# Patient Record
Sex: Female | Born: 1985 | Race: Black or African American | Hispanic: No | Marital: Single | Smoking: Never smoker
Health system: Southern US, Community
[De-identification: ages and names within clinical notes are randomized; demographics above are authoritative.]

## PROBLEM LIST (undated history)

## (undated) DIAGNOSIS — D684 Acquired coagulation factor deficiency: Secondary | ICD-10-CM

## (undated) DIAGNOSIS — Z5189 Encounter for other specified aftercare: Secondary | ICD-10-CM

## (undated) DIAGNOSIS — Z789 Other specified health status: Secondary | ICD-10-CM

## (undated) HISTORY — PX: NO PAST SURGERIES: SHX2092

---

## 2019-09-22 ENCOUNTER — Inpatient Hospital Stay (HOSPITAL_COMMUNITY)
Admission: AD | Admit: 2019-09-22 | Discharge: 2019-09-23 | Disposition: A | Discharge status: Home / Self Care | Payer: Medicaid Other | Attending: Obstetrics & Gynecology | Admitting: Obstetrics & Gynecology

## 2019-09-22 ENCOUNTER — Encounter (HOSPITAL_COMMUNITY): Payer: Self-pay | Admitting: Obstetrics & Gynecology

## 2019-09-22 ENCOUNTER — Other Ambulatory Visit: Payer: Self-pay

## 2019-09-22 DIAGNOSIS — O209 Hemorrhage in early pregnancy, unspecified: Secondary | ICD-10-CM

## 2019-09-22 DIAGNOSIS — R102 Pelvic and perineal pain: Secondary | ICD-10-CM | POA: Insufficient documentation

## 2019-09-22 DIAGNOSIS — Z3A12 12 weeks gestation of pregnancy: Secondary | ICD-10-CM | POA: Diagnosis not present

## 2019-09-22 DIAGNOSIS — O021 Missed abortion: Secondary | ICD-10-CM | POA: Diagnosis not present

## 2019-09-22 HISTORY — DX: Other specified health status: Z78.9

## 2019-09-22 LAB — POCT PREGNANCY, URINE: Preg Test, Ur: POSITIVE — AB

## 2019-09-22 NOTE — MAU Note (Signed)
Began having light cramping and bleeding yesterday, has intensified today.  States she only sees it when she wipes.  + HPT at the beginning of March.  LMP 06/28/19.  Denies any recent intercourse.

## 2019-09-23 ENCOUNTER — Inpatient Hospital Stay (HOSPITAL_COMMUNITY): Payer: Medicaid Other

## 2019-09-23 ENCOUNTER — Encounter (HOSPITAL_COMMUNITY): Payer: Self-pay | Admitting: Obstetrics & Gynecology

## 2019-09-23 DIAGNOSIS — Z3A12 12 weeks gestation of pregnancy: Secondary | ICD-10-CM

## 2019-09-23 DIAGNOSIS — O021 Missed abortion: Secondary | ICD-10-CM

## 2019-09-23 DIAGNOSIS — O209 Hemorrhage in early pregnancy, unspecified: Secondary | ICD-10-CM

## 2019-09-23 LAB — COMPREHENSIVE METABOLIC PANEL
ALT: 14 U/L (ref 0–44)
AST: 13 U/L — ABNORMAL LOW (ref 15–41)
Albumin: 4.2 g/dL (ref 3.5–5.0)
Alkaline Phosphatase: 39 U/L (ref 38–126)
Anion gap: 8 (ref 5–15)
BUN: <5 mg/dL — ABNORMAL LOW (ref 6–20)
CO2: 24 mmol/L (ref 22–32)
Calcium: 9.4 mg/dL (ref 8.9–10.3)
Chloride: 106 mmol/L (ref 98–111)
Creatinine, Ser: 0.57 mg/dL (ref 0.44–1.00)
GFR calc Af Amer: >60 mL/min (ref >60)
GFR calc non Af Amer: >60 mL/min (ref >60)
Glucose, Bld: 91 mg/dL (ref 70–99)
Potassium: 3.9 mmol/L (ref 3.5–5.1)
Sodium: 138 mmol/L (ref 135–145)
Total Bilirubin: 0.4 mg/dL (ref 0.3–1.2)
Total Protein: 6.6 g/dL (ref 6.5–8.1)

## 2019-09-23 LAB — URINALYSIS, ROUTINE W REFLEX MICROSCOPIC
Bilirubin Urine: NEGATIVE
Glucose, UA: NEGATIVE mg/dL
Ketones, ur: NEGATIVE mg/dL
Leukocytes,Ua: NEGATIVE
Nitrite: NEGATIVE
Protein, ur: NEGATIVE mg/dL
Specific Gravity, Urine: 1.01 (ref 1.005–1.030)
pH: 8 (ref 5.0–8.0)

## 2019-09-23 LAB — CBC
HCT: 35.4 % — ABNORMAL LOW (ref 36.0–46.0)
Hemoglobin: 11.4 g/dL — ABNORMAL LOW (ref 12.0–15.0)
MCH: 28.8 pg (ref 26.0–34.0)
MCHC: 32.2 g/dL (ref 30.0–36.0)
MCV: 89.4 fL (ref 80.0–100.0)
Platelets: 268 10*3/uL (ref 150–400)
RBC: 3.96 MIL/uL (ref 3.87–5.11)
RDW: 12.6 % (ref 11.5–15.5)
WBC: 5.9 10*3/uL (ref 4.0–10.5)
nRBC: 0 % (ref 0.0–0.2)

## 2019-09-23 LAB — WET PREP, GENITAL
Sperm: NONE SEEN
Trich, Wet Prep: NONE SEEN
Yeast Wet Prep HPF POC: NONE SEEN

## 2019-09-23 LAB — HCG, QUANTITATIVE, PREGNANCY: hCG, Beta Chain, Quant, S: 1520 m[IU]/mL — ABNORMAL HIGH (ref <5)

## 2019-09-23 LAB — ABO/RH: ABO/RH(D): B POS

## 2019-09-23 LAB — GC/CHLAMYDIA PROBE AMP (~~LOC~~) NOT AT ARMC
Chlamydia: NEGATIVE
Comment: NEGATIVE
Comment: NORMAL
Neisseria Gonorrhea: NEGATIVE

## 2019-09-23 LAB — HIV ANTIBODY (ROUTINE TESTING W REFLEX): HIV Screen 4th Generation wRfx: NONREACTIVE

## 2019-09-23 NOTE — MAU Provider Note (Addendum)
Chief Complaint: Abdominal Pain and Vaginal Bleeding   First Provider Initiated Contact with Patient 09/23/19 0032        SUBJECTIVE HPI: Michelle Guzman is a 34 y.o. [id number] at [redacted]w[redacted]d by LMP who presents to maternity admissions reporting pelvic pain and vaginal bleeding since yesterday.  Both have gotten worse today. . She denies vaginal itching/burning, urinary symptoms, h/a, dizziness, n/v, or fever/chills.    Abdominal Pain This is a new problem. The current episode started yesterday. The onset quality is gradual. The problem occurs intermittently. The problem has been unchanged. The quality of the pain is cramping. The abdominal pain does not radiate. Pertinent negatives include no constipation, diarrhea, dysuria, fever, frequency, nausea or vomiting. Nothing aggravates the pain. The pain is relieved by nothing. She has tried nothing for the symptoms.  Vaginal Bleeding The patient's primary symptoms include pelvic pain and vaginal bleeding. The patient's pertinent negatives include no genital itching, genital lesions or genital odor. This is a new problem. The current episode started yesterday. The problem occurs intermittently. The problem has been unchanged. The pain is mild. The problem affects both sides. She is pregnant. Associated symptoms include abdominal pain. Pertinent negatives include no constipation, diarrhea, dysuria, fever, frequency, nausea or vomiting. The vaginal discharge was bloody. The vaginal bleeding is lighter than menses. She has not been passing clots. She has not been passing tissue. Nothing aggravates the symptoms. She has tried nothing for the symptoms.   RN Note Began having light cramping and bleeding yesterday, has intensified today.  States she only sees it when she wipes.  + HPT at the beginning of March.  LMP 06/28/19.  Denies any recent intercourse  Past Medical History:  Diagnosis Date  . Medical history non-contributory    Past Surgical History:   Procedure Laterality Date  . NO PAST SURGERIES     Social History   Socioeconomic History  . Marital status: Single    Spouse name: Not on file  . Number of children: Not on file  . Years of education: Not on file  . Highest education level: Not on file  Occupational History  . Not on file  Tobacco Use  . Smoking status: Never Smoker  . Smokeless tobacco: Never Used  Substance and Sexual Activity  . Alcohol use: Not Currently  . Drug use: Not Currently    Types: Marijuana    Comment: stopped a couple weeks ago per pt on 09/23/19  . Sexual activity: Not Currently  Other Topics Concern  . Not on file  Social History Narrative  . Not on file   Social Determinants of Health   Financial Resource Strain:   . Difficulty of Paying Living Expenses:   Food Insecurity:   . Worried About Programme researcher, broadcasting/film/video in the Last Year:   . Barista in the Last Year:   Transportation Needs:   . Freight forwarder (Medical):   Marland Kitchen Lack of Transportation (Non-Medical):   Physical Activity:   . Days of Exercise per Week:   . Minutes of Exercise per Session:   Stress:   . Feeling of Stress :   Social Connections:   . Frequency of Communication with Friends and Family:   . Frequency of Social Gatherings with Friends and Family:   . Attends Religious Services:   . Active Member of Clubs or Organizations:   . Attends Banker Meetings:   Marland Kitchen Marital Status:   Intimate Partner Violence:   .  Fear of Current or Ex-Partner:   . Emotionally Abused:   Marland Kitchen Physically Abused:   . Sexually Abused:    No current facility-administered medications on file prior to encounter.   Current Outpatient Medications on File Prior to Encounter  Medication Sig Dispense Refill  . Prenatal Vit-Fe Fumarate-FA (PRENATAL PO) Take by mouth.     Allergies  Allergen Reactions  . Orange Juice [Orange Oil] Swelling    I have reviewed patient's Past Medical Hx, Surgical Hx, Family Hx, Social Hx,  medications and allergies.   ROS:  Review of Systems  Constitutional: Negative for fever.  Gastrointestinal: Positive for abdominal pain. Negative for constipation, diarrhea, nausea and vomiting.  Genitourinary: Positive for pelvic pain and vaginal bleeding. Negative for dysuria and frequency.   Review of Systems  Other systems negative   Physical Exam  Physical Exam Patient Vitals for the past 24 hrs:  BP Temp Pulse Resp Weight  09/22/19 2254 127/74 98.9 F (37.2 C) 60 15 49.5 kg   Constitutional: Well-developed, well-nourished female in no acute distress.  Cardiovascular: normal rate Respiratory: normal effort GI: Abd soft, non-tender. Pos BS x 4 MS: Extremities nontender, no edema, normal ROM Neurologic: Alert and oriented x 4.  GU: Neg CVAT.  PELVIC EXAM: Cervix pink, visually closed, without lesion, small amount of bloody discharge, vaginal walls and external genitalia normal   LAB RESULTS Results for orders placed or performed during the hospital encounter of 09/22/19 (from the past 24 hour(s))  Urinalysis, Routine w reflex microscopic     Status: Abnormal   Collection Time: 09/22/19 10:47 PM  Result Value Ref Range   Color, Urine YELLOW YELLOW   APPearance CLEAR CLEAR   Specific Gravity, Urine 1.010 1.005 - 1.030   pH 8.0 5.0 - 8.0   Glucose, UA NEGATIVE NEGATIVE mg/dL   Hgb urine dipstick LARGE (A) NEGATIVE   Bilirubin Urine NEGATIVE NEGATIVE   Ketones, ur NEGATIVE NEGATIVE mg/dL   Protein, ur NEGATIVE NEGATIVE mg/dL   Nitrite NEGATIVE NEGATIVE   Leukocytes,Ua NEGATIVE NEGATIVE   RBC / HPF 0-5 0 - 5 RBC/hpf   WBC, UA 0-5 0 - 5 WBC/hpf   Bacteria, UA RARE (A) NONE SEEN   Squamous Epithelial / LPF 0-5 0 - 5   Mucus PRESENT   Pregnancy, urine POC     Status: Abnormal   Collection Time: 09/22/19 11:15 PM  Result Value Ref Range   Preg Test, Ur POSITIVE (A) NEGATIVE  Wet prep, genital     Status: Abnormal   Collection Time: 09/22/19 11:46 PM  Result Value  Ref Range   Yeast Wet Prep HPF POC NONE SEEN NONE SEEN   Trich, Wet Prep NONE SEEN NONE SEEN   Clue Cells Wet Prep HPF POC PRESENT (A) NONE SEEN   WBC, Wet Prep HPF POC FEW (A) NONE SEEN   Sperm NONE SEEN   CBC     Status: Abnormal   Collection Time: 09/22/19 11:54 PM  Result Value Ref Range   WBC 5.9 4.0 - 10.5 K/uL   RBC 3.96 3.87 - 5.11 MIL/uL   Hemoglobin 11.4 (L) 12.0 - 15.0 g/dL   HCT 69.4 (L) 85.4 - 62.7 %   MCV 89.4 80.0 - 100.0 fL   MCH 28.8 26.0 - 34.0 pg   MCHC 32.2 30.0 - 36.0 g/dL   RDW 03.5 00.9 - 38.1 %   Platelets 268 150 - 400 K/uL   nRBC 0.0 0.0 - 0.2 %  hCG, quantitative,  pregnancy     Status: Abnormal   Collection Time: 09/22/19 11:54 PM  Result Value Ref Range   hCG, Beta Chain, Quant, S 1,520 (H) <5 mIU/mL  Comprehensive metabolic panel     Status: Abnormal   Collection Time: 09/22/19 11:54 PM  Result Value Ref Range   Sodium 138 135 - 145 mmol/L   Potassium 3.9 3.5 - 5.1 mmol/L   Chloride 106 98 - 111 mmol/L   CO2 24 22 - 32 mmol/L   Glucose, Bld 91 70 - 99 mg/dL   BUN <5 (L) 6 - 20 mg/dL   Creatinine, Ser 0.57 0.44 - 1.00 mg/dL   Calcium 9.4 8.9 - 10.3 mg/dL   Total Protein 6.6 6.5 - 8.1 g/dL   Albumin 4.2 3.5 - 5.0 g/dL   AST 13 (L) 15 - 41 U/L   ALT 14 0 - 44 U/L   Alkaline Phosphatase 39 38 - 126 U/L   Total Bilirubin 0.4 0.3 - 1.2 mg/dL   GFR calc non Af Amer >60 >60 mL/min   GFR calc Af Amer >60 >60 mL/min   Anion gap 8 5 - 15  HIV Antibody (routine testing w rflx)     Status: None   Collection Time: 09/22/19 11:54 PM  Result Value Ref Range   HIV Screen 4th Generation wRfx NON REACTIVE NON REACTIVE  ABO/Rh     Status: None   Collection Time: 09/23/19  1:38 AM  Result Value Ref Range   ABO/RH(D) B POS    No rh immune globuloin      NOT A RH IMMUNE GLOBULIN CANDIDATE, PT RH POSITIVE Performed at [facility], [address] [place], [place] [address]     IMAGING [place] Comp Less 14 Wks  Result Date: 09/23/2019 CLINICAL  DATA:  Bleeding, cramping EXAM: OBSTETRIC <14 WK [place] AND TRANSVAGINAL OB US TECHNIQUE: Both transabdominal and transvaginal ultrasound examinations were performed for complete evaluation of the gestation as well as the maternal uterus, adnexal regions, and pelvic cul-de-sac. Transvaginal technique was performed to assess early pregnancy. COMPARISON:  None. FINDINGS: Intrauterine gestational sac: Single, irregularly shaped at the level of the cervix. Yolk sac:  Not visualized Embryo:  Not visualized Cardiac Activity: Not visualized Heart Rate:   bpm MSD: 36.4 mm   8 w   6 d CRL:    mm    w    d                  [place] EDC: Subchorionic hemorrhage:  None visualized. Maternal uterus/adnexae: No adnexal mass or free fluid. IMPRESSION: Irregularly-shaped gestational sac at the level of the cervix. No fetal pole visualized. Findings meet definitive criteria for failed pregnancy. This follows SRU consensus guidelines: Diagnostic Criteria for Nonviable Pregnancy Early in the First Trimester. Alison Stalling J Med [phone]. Electronically Signed   By: Rolm Baptise M.D.   On: 09/23/2019 01:08   US OB Transvaginal  Result Date: 09/23/2019 CLINICAL DATA:  Bleeding, cramping EXAM: OBSTETRIC <14 WK [place] AND TRANSVAGINAL OB US TECHNIQUE: Both transabdominal and transvaginal ultrasound examinations were performed for complete evaluation of the gestation as well as the maternal uterus, adnexal regions, and pelvic cul-de-sac. Transvaginal technique was performed to assess early pregnancy. COMPARISON:  None. FINDINGS: Intrauterine gestational sac: Single, irregularly shaped at the level of the cervix. Yolk sac:  Not visualized Embryo:  Not visualized Cardiac Activity: Not visualized Heart Rate:   bpm MSD: 36.4 mm   8  w   6 d CRL:    mm    w    d                  [place] EDC: Subchorionic hemorrhage:  None visualized. Maternal uterus/adnexae: No adnexal mass or free fluid. IMPRESSION: Irregularly-shaped gestational sac at the level of the  cervix. No fetal pole visualized. Findings meet definitive criteria for failed pregnancy. This follows SRU consensus guidelines: Diagnostic Criteria for Nonviable Pregnancy Early in the First Trimester. Macy Mis J Med [phone]. Electronically Signed   By: Charlett Nose M.D.   On: 09/23/2019 01:08     MAU Management/MDM: Ordered usual first trimester r/o ectopic labs.   Pelvic exam and cultures done Will check baseline Ultrasound to rule out ectopic.  This bleeding/pain can represent a normal pregnancy with bleeding, spontaneous abortion or even an ectopic which can be life-threatening.  The process as listed above helps to determine which of these is present.  Reviewed findings with patient.  Discussed that ultrasound reflects a missed abortion.  Discussed option of expectant management vs cytotec induction.  She prefers to wait expectantly   We reviewed natural course of SAB and warning signs.    ASSESSMENT Single intrauterine pregnancy at [redacted]w[redacted]d Missed abortion   PLAN Discharge home Expectant management Bleeding precautions Will schedule followup at clinic post SAB  Pt stable at time of discharge. Encouraged to return here or to other Urgent Care/ED if she develops worsening of symptoms, increase in pain, fever, or other concerning symptoms.    Wynelle Bourgeois CNM, MSN Certified Nurse-Midwife 09/23/2019  12:33 AM

## 2019-09-23 NOTE — Discharge Instructions (Signed)

## 2019-09-24 ENCOUNTER — Encounter (HOSPITAL_COMMUNITY): Payer: Self-pay | Admitting: Obstetrics and Gynecology

## 2019-09-24 ENCOUNTER — Ambulatory Visit (HOSPITAL_COMMUNITY)
Admission: AD | Admit: 2019-09-24 | Discharge: 2019-09-24 | Disposition: A | Discharge status: Home / Self Care | Payer: Medicaid Other | Attending: Obstetrics and Gynecology | Admitting: Obstetrics and Gynecology

## 2019-09-24 ENCOUNTER — Inpatient Hospital Stay (HOSPITAL_COMMUNITY): Payer: Medicaid Other | Admitting: Anesthesiology

## 2019-09-24 ENCOUNTER — Inpatient Hospital Stay (HOSPITAL_COMMUNITY): Payer: Medicaid Other

## 2019-09-24 ENCOUNTER — Encounter (HOSPITAL_COMMUNITY)
Admission: AD | Disposition: A | Discharge status: Home / Self Care | Payer: Self-pay | Source: Home / Self Care | Attending: Obstetrics and Gynecology

## 2019-09-24 DIAGNOSIS — E561 Deficiency of vitamin K: Secondary | ICD-10-CM | POA: Insufficient documentation

## 2019-09-24 DIAGNOSIS — O034 Incomplete spontaneous abortion without complication: Secondary | ICD-10-CM | POA: Diagnosis present

## 2019-09-24 DIAGNOSIS — O021 Missed abortion: Secondary | ICD-10-CM

## 2019-09-24 DIAGNOSIS — Z20822 Contact with and (suspected) exposure to covid-19: Secondary | ICD-10-CM | POA: Insufficient documentation

## 2019-09-24 DIAGNOSIS — Z3A08 8 weeks gestation of pregnancy: Secondary | ICD-10-CM

## 2019-09-24 DIAGNOSIS — O209 Hemorrhage in early pregnancy, unspecified: Secondary | ICD-10-CM

## 2019-09-24 DIAGNOSIS — O031 Delayed or excessive hemorrhage following incomplete spontaneous abortion: Secondary | ICD-10-CM

## 2019-09-24 HISTORY — PX: DILATION AND CURETTAGE OF UTERUS: SHX78

## 2019-09-24 HISTORY — DX: Acquired coagulation factor deficiency: D68.4

## 2019-09-24 LAB — DIC (DISSEMINATED INTRAVASCULAR COAGULATION)PANEL
D-Dimer, Quant: <0.27 ug/mL-FEU (ref 0.00–0.50)
Fibrinogen: 154 mg/dL — ABNORMAL LOW (ref 210–475)
INR: 1.2 (ref 0.8–1.2)
Platelets: 210 10*3/uL (ref 150–400)
Prothrombin Time: 15 seconds (ref 11.4–15.2)
Smear Review: NONE SEEN
aPTT: 30 seconds (ref 24–36)

## 2019-09-24 LAB — RESPIRATORY PANEL BY RT PCR (FLU A&B, COVID)
Influenza A by PCR: NEGATIVE
Influenza B by PCR: NEGATIVE
SARS Coronavirus 2 by RT PCR: NEGATIVE

## 2019-09-24 LAB — PREPARE RBC (CROSSMATCH)

## 2019-09-24 LAB — COMPREHENSIVE METABOLIC PANEL
ALT: 16 U/L (ref 0–44)
AST: 13 U/L — ABNORMAL LOW (ref 15–41)
Albumin: 2.6 g/dL — ABNORMAL LOW (ref 3.5–5.0)
Alkaline Phosphatase: 27 U/L — ABNORMAL LOW (ref 38–126)
Anion gap: 8 (ref 5–15)
BUN: 9 mg/dL (ref 6–20)
CO2: 20 mmol/L — ABNORMAL LOW (ref 22–32)
Calcium: 7.8 mg/dL — ABNORMAL LOW (ref 8.9–10.3)
Chloride: 111 mmol/L (ref 98–111)
Creatinine, Ser: 0.59 mg/dL (ref 0.44–1.00)
GFR calc Af Amer: >60 mL/min (ref >60)
GFR calc non Af Amer: >60 mL/min (ref >60)
Glucose, Bld: 99 mg/dL (ref 70–99)
Potassium: 4.1 mmol/L (ref 3.5–5.1)
Sodium: 139 mmol/L (ref 135–145)
Total Bilirubin: 0.3 mg/dL (ref 0.3–1.2)
Total Protein: 4.3 g/dL — ABNORMAL LOW (ref 6.5–8.1)

## 2019-09-24 LAB — POCT I-STAT, CHEM 8
BUN: 5 mg/dL — ABNORMAL LOW (ref 6–20)
Calcium, Ion: 1.07 mmol/L — ABNORMAL LOW (ref 1.15–1.40)
Chloride: 108 mmol/L (ref 98–111)
Creatinine, Ser: 0.4 mg/dL — ABNORMAL LOW (ref 0.44–1.00)
Glucose, Bld: 91 mg/dL (ref 70–99)
HCT: 18 % — ABNORMAL LOW (ref 36.0–46.0)
Hemoglobin: 6.1 g/dL — CL (ref 12.0–15.0)
Potassium: 4.8 mmol/L (ref 3.5–5.1)
Sodium: 141 mmol/L (ref 135–145)
TCO2: 21 mmol/L — ABNORMAL LOW (ref 22–32)

## 2019-09-24 LAB — CBC
HCT: 24.3 % — ABNORMAL LOW (ref 36.0–46.0)
Hemoglobin: 7.5 g/dL — ABNORMAL LOW (ref 12.0–15.0)
MCH: 28.4 pg (ref 26.0–34.0)
MCHC: 30.9 g/dL (ref 30.0–36.0)
MCV: 92 fL (ref 80.0–100.0)
Platelets: 213 10*3/uL (ref 150–400)
RBC: 2.64 MIL/uL — ABNORMAL LOW (ref 3.87–5.11)
RDW: 12.8 % (ref 11.5–15.5)
WBC: 7.4 10*3/uL (ref 4.0–10.5)
nRBC: 0 % (ref 0.0–0.2)

## 2019-09-24 SURGERY — DILATION AND CURETTAGE
Anesthesia: General

## 2019-09-24 MED ORDER — SUCCINYLCHOLINE CHLORIDE 200 MG/10ML IV SOSY
PREFILLED_SYRINGE | INTRAVENOUS | Status: DC | PRN
Start: 2019-09-24 — End: 2019-09-24
  Administered 2019-09-24: 100 mg via INTRAVENOUS

## 2019-09-24 MED ORDER — SILVER NITRATE-POT NITRATE 75-25 % EX MISC
CUTANEOUS | Status: AC
  Filled 2019-09-24: qty 10

## 2019-09-24 MED ORDER — DEXAMETHASONE SODIUM PHOSPHATE 10 MG/ML IJ SOLN
INTRAMUSCULAR | Status: DC | PRN
  Administered 2019-09-24: 4 mg via INTRAVENOUS

## 2019-09-24 MED ORDER — LACTATED RINGERS IV BOLUS
1000.0000 mL | Freq: Once | INTRAVENOUS | Status: AC
  Administered 2019-09-24: 1000 mL via INTRAVENOUS

## 2019-09-24 MED ORDER — ROCURONIUM BROMIDE 10 MG/ML (PF) SYRINGE
PREFILLED_SYRINGE | INTRAVENOUS | Status: AC
  Filled 2019-09-24: qty 10

## 2019-09-24 MED ORDER — SODIUM CHLORIDE 0.9% IV SOLUTION: Freq: Once | INTRAVENOUS | Status: DC

## 2019-09-24 MED ORDER — AMISULPRIDE (ANTIEMETIC) 5 MG/2ML IV SOLN
INTRAVENOUS | Status: AC
  Administered 2019-09-24: 5 mg via INTRAVENOUS
  Filled 2019-09-24: qty 2

## 2019-09-24 MED ORDER — SUCCINYLCHOLINE CHLORIDE 200 MG/10ML IV SOSY
PREFILLED_SYRINGE | INTRAVENOUS | Status: AC
  Filled 2019-09-24: qty 10

## 2019-09-24 MED ORDER — LACTATED RINGERS IV SOLN: INTRAVENOUS | Status: DC

## 2019-09-24 MED ORDER — PROPOFOL 10 MG/ML IV BOLUS
INTRAVENOUS | Status: AC
  Filled 2019-09-24: qty 20

## 2019-09-24 MED ORDER — SODIUM CHLORIDE 0.9 % IV SOLN
100.0000 mg | Freq: Once | INTRAVENOUS | Status: AC
  Administered 2019-09-24: 200 mg via INTRAVENOUS
  Filled 2019-09-24 (×2): qty 100

## 2019-09-24 MED ORDER — MIDAZOLAM HCL 5 MG/5ML IJ SOLN
INTRAMUSCULAR | Status: DC | PRN
  Administered 2019-09-24: 2 mg via INTRAVENOUS

## 2019-09-24 MED ORDER — LIDOCAINE 2% (20 MG/ML) 5 ML SYRINGE
INTRAMUSCULAR | Status: DC | PRN
  Administered 2019-09-24: 40 mg via INTRAVENOUS

## 2019-09-24 MED ORDER — LIDOCAINE HCL 1 % IJ SOLN
INTRAMUSCULAR | Status: AC
  Filled 2019-09-24: qty 20

## 2019-09-24 MED ORDER — PHENYLEPHRINE 40 MCG/ML (10ML) SYRINGE FOR IV PUSH (FOR BLOOD PRESSURE SUPPORT)
PREFILLED_SYRINGE | INTRAVENOUS | Status: AC
  Filled 2019-09-24: qty 20

## 2019-09-24 MED ORDER — AMISULPRIDE (ANTIEMETIC) 5 MG/2ML IV SOLN
INTRAVENOUS | Status: AC
  Filled 2019-09-24: qty 2

## 2019-09-24 MED ORDER — SODIUM CHLORIDE 0.9 % IV BOLUS
1000.0000 mL | Freq: Once | INTRAVENOUS | Status: AC
  Administered 2019-09-24: 1000 mL via INTRAVENOUS

## 2019-09-24 MED ORDER — HYDROMORPHONE HCL 1 MG/ML IJ SOLN
1.0000 mg | Freq: Once | INTRAMUSCULAR | Status: AC
  Administered 2019-09-24: 1 mg via INTRAVENOUS
  Filled 2019-09-24: qty 1

## 2019-09-24 MED ORDER — MIDAZOLAM HCL 2 MG/2ML IJ SOLN
INTRAMUSCULAR | Status: AC
  Filled 2019-09-24: qty 2

## 2019-09-24 MED ORDER — PROPOFOL 10 MG/ML IV BOLUS
INTRAVENOUS | Status: DC | PRN
  Administered 2019-09-24: 100 mg via INTRAVENOUS

## 2019-09-24 MED ORDER — ALBUMIN HUMAN 5 % IV SOLN: INTRAVENOUS | Status: DC | PRN

## 2019-09-24 MED ORDER — PHENYLEPHRINE HCL (PRESSORS) 10 MG/ML IV SOLN
INTRAVENOUS | Status: DC | PRN
  Administered 2019-09-24 (×4): 120 ug via INTRAVENOUS
  Administered 2019-09-24: 80 ug via INTRAVENOUS
  Administered 2019-09-24 (×2): 120 ug via INTRAVENOUS

## 2019-09-24 MED ORDER — BUPIVACAINE HCL (PF) 0.25 % IJ SOLN
INTRAMUSCULAR | Status: AC
  Filled 2019-09-24: qty 30

## 2019-09-24 MED ORDER — EPHEDRINE 5 MG/ML INJ
INTRAVENOUS | Status: AC
  Filled 2019-09-24: qty 20

## 2019-09-24 MED ORDER — IBUPROFEN 600 MG PO TABS
600.0000 mg | ORAL_TABLET | Freq: Four times a day (QID) | ORAL | 1 refills | Status: DC | PRN
Start: 2019-09-24 — End: 2021-08-19

## 2019-09-24 MED ORDER — SODIUM CHLORIDE 0.9 % IV SOLN: INTRAVENOUS | Status: DC

## 2019-09-24 MED ORDER — BUPIVACAINE HCL 0.25 % IJ SOLN
INTRAMUSCULAR | Status: DC | PRN
  Administered 2019-09-24: 21 mL

## 2019-09-24 MED ORDER — ONDANSETRON HCL 4 MG/2ML IJ SOLN
INTRAMUSCULAR | Status: DC | PRN
  Administered 2019-09-24 (×2): 4 mg via INTRAVENOUS

## 2019-09-24 MED ORDER — FENTANYL CITRATE (PF) 250 MCG/5ML IJ SOLN
INTRAMUSCULAR | Status: AC
  Filled 2019-09-24: qty 5

## 2019-09-24 SURGICAL SUPPLY — 26 items
CATH ROBINSON RED A/P 16FR (CATHETERS) ×3 IMPLANT
DECANTER SPIKE VIAL GLASS SM (MISCELLANEOUS) ×6 IMPLANT
FILTER UTR ASPR ASSEMBLY (MISCELLANEOUS) ×6 IMPLANT
GLOVE BIOGEL PI IND STRL 7.0 (GLOVE) ×3 IMPLANT
GLOVE BIOGEL PI IND STRL 7.5 (GLOVE) ×1 IMPLANT
GLOVE BIOGEL PI INDICATOR 7.0 (GLOVE) ×6
GLOVE BIOGEL PI INDICATOR 7.5 (GLOVE) ×2
GLOVE ECLIPSE 7.0 STRL STRAW (GLOVE) ×6 IMPLANT
GOWN STRL REUS W/ TWL LRG LVL3 (GOWN DISPOSABLE) ×3 IMPLANT
GOWN STRL REUS W/ TWL XL LVL3 (GOWN DISPOSABLE) ×1 IMPLANT
GOWN STRL REUS W/TWL LRG LVL3 (GOWN DISPOSABLE) ×6
GOWN STRL REUS W/TWL XL LVL3 (GOWN DISPOSABLE) ×2
Glove Neodern Ster SZ 7 ×2 IMPLANT
HOSE CONNECTING 18IN BERKELEY (TUBING) ×6 IMPLANT
KIT BERKELEY 1ST TRI 3/8 NO TR (MISCELLANEOUS) ×6 IMPLANT
KIT BERKELEY 1ST TRIMESTER 3/8 (MISCELLANEOUS) ×9 IMPLANT
NS IRRIG 1000ML POUR BTL (IV SOLUTION) ×6 IMPLANT
PACK VAGINAL MINOR WOMEN LF (CUSTOM PROCEDURE TRAY) ×6 IMPLANT
PAD OB MATERNITY 4.3X12.25 (PERSONAL CARE ITEMS) ×6 IMPLANT
SET BERKELEY SUCTION TUBING (SUCTIONS) ×6 IMPLANT
TOWEL GREEN STERILE FF (TOWEL DISPOSABLE) ×12 IMPLANT
UNDERPAD 30X36 HEAVY ABSORB (UNDERPADS AND DIAPERS) ×3 IMPLANT
VACURETTE 10 RIGID CVD (CANNULA) ×2 IMPLANT
VACURETTE 7MM CVD STRL WRAP (CANNULA) IMPLANT
VACURETTE 8 RIGID CVD (CANNULA) IMPLANT
VACURETTE 9 RIGID CVD (CANNULA) IMPLANT

## 2019-09-24 NOTE — MAU Provider Note (Signed)
Chief Complaint: Abdominal Pain and Vaginal Bleeding   None     SUBJECTIVE HPI: Michelle Guzman is a 34 y.o. [id number] at [redacted]w[redacted]d by LMP, [redacted]w[redacted]d by [place] on 09/23/19 who presents to maternity admissions via EMS reporting heavy vaginal bleeding with clots. She initially presented 09/23/19 with light bleeding and was diagnosed with missed ab at [redacted]w[redacted]d by [place].  She selected expectant management with follow up in [place] office.  She reports increased bleeding started early this morning and has gotten heavier and heavier, soaking through her clothes and onto the floor at home. There is associated 7/10 intermittent cramping abdominal pain that radiates to her low back She has not tried any treatments. There are no other symptoms.      HPI  Past Medical History:  Diagnosis Date  . Medical history non-contributory    Past Surgical History:  Procedure Laterality Date  . NO PAST SURGERIES     Social History   Socioeconomic History  . Marital status: Single    Spouse name: Not on file  . Number of children: Not on file  . Years of education: Not on file  . Highest education level: Not on file  Occupational History  . Not on file  Tobacco Use  . Smoking status: Never Smoker  . Smokeless tobacco: Never Used  Substance and Sexual Activity  . Alcohol use: Not Currently  . Drug use: Not Currently    Types: Marijuana    Comment: stopped a couple weeks ago per pt on 09/23/19  . Sexual activity: Not Currently  Other Topics Concern  . Not on file  Social History Narrative  . Not on file   Social Determinants of Health   Financial Resource Strain:   . Difficulty of Paying Living Expenses:   Food Insecurity:   . Worried About Charity fundraiser in the Last Year:   . Arboriculturist in the Last Year:   Transportation Needs:   . Film/video editor (Medical):   Marland Kitchen Lack of Transportation (Non-Medical):   Physical Activity:   . Days of Exercise per Week:   . Minutes of Exercise per Session:    Stress:   . Feeling of Stress :   Social Connections:   . Frequency of Communication with Friends and Family:   . Frequency of Social Gatherings with Friends and Family:   . Attends Religious Services:   . Active Member of Clubs or Organizations:   . Attends Archivist Meetings:   Marland Kitchen Marital Status:   Intimate Partner Violence:   . Fear of Current or Ex-Partner:   . Emotionally Abused:   Marland Kitchen Physically Abused:   . Sexually Abused:    No current facility-administered medications on file prior to encounter.   Current Outpatient Medications on File Prior to Encounter  Medication Sig Dispense Refill  . Prenatal Vit-Fe Fumarate-FA (PRENATAL PO) Take by mouth.     Allergies  Allergen Reactions  . Orange Juice [Orange Oil] Swelling    ROS:  Review of Systems  Constitutional: Negative for chills and fever.  Respiratory: Negative for cough and shortness of breath.   Cardiovascular: Negative for chest pain.  Gastrointestinal: Positive for abdominal pain. Negative for nausea and vomiting.  Genitourinary: Positive for vaginal bleeding. Negative for dysuria, frequency and urgency.  Musculoskeletal: Positive for back pain.  Neurological: Negative for dizziness and headaches.     I have reviewed patient's Past Medical Hx, Surgical Hx, Family Hx, Social Hx, medications  and allergies.   Physical Exam   Patient Vitals for the past 24 hrs:  BP Temp Pulse Resp SpO2  09/24/19 0600 (!) 99/48 -- 86 -- --  09/24/19 0555 (!) 101/46 -- 76 -- --  09/24/19 0550 (!) 108/47 -- 81 -- --  09/24/19 0546 (!) 98/50 -- 77 -- --  09/24/19 0536 (!) 133/43 -- 84 -- 100 %  09/24/19 0535 -- -- -- -- 100 %  09/24/19 0531 (!) 110/59 -- 88 -- --  09/24/19 0527 (!) 110/49 -- 90 -- --  09/24/19 0521 (!) 121/45 -- 100 -- --  09/24/19 0506 95/62 -- (!) 128 -- --  09/24/19 0452 (!) 74/55 -- (!) 102 -- --  09/24/19 0445 (!) 98/52 98.2 F (36.8 C) (!) 120 20 --   Constitutional: Well-developed,  well-nourished female in moderate distress.  HEART: normal rate, heart sounds, regular rhythm RESP: normal effort, lung sounds clear and equal bilaterally GI: Abd soft, non-tender. Pos BS x 4 MS: Extremities nontender, no edema, normal ROM Neurologic: Alert and oriented x 4.  GU: Neg CVAT.  PELVIC EXAM: Large amount bright red bleeding with 10+ fox swabs used to visualize cervix.  Large clot and gray tissue removed from cervical os during exam.  Bleeding slowed after exam but with moderate amount and golf ball sized clots on pad within 4-5 minutes.  Cervix pink, visually 1-2 cm, without lesion, vaginal walls and external genitalia normal Bimanual exam: Cervix 0/long/high, firm, anterior, neg CMT, uterus nontender, nonenlarged, adnexa without tenderness, enlargement, or mass    LAB RESULTS Results for orders placed or performed during the hospital encounter of 09/24/19 (from the past 24 hour(s))  CBC     Status: Abnormal   Collection Time: 09/24/19  5:08 AM  Result Value Ref Range   WBC 7.4 4.0 - 10.5 K/uL   RBC 2.64 (L) 3.87 - 5.11 MIL/uL   Hemoglobin 7.5 (L) 12.0 - 15.0 g/dL   HCT 27.2 (L) 53.6 - 64.4 %   MCV 92.0 80.0 - 100.0 fL   MCH 28.4 26.0 - 34.0 pg   MCHC 30.9 30.0 - 36.0 g/dL   RDW 03.4 74.2 - 59.5 %   Platelets 213 150 - 400 K/uL   nRBC 0.0 0.0 - 0.2 %  Comprehensive metabolic panel     Status: Abnormal   Collection Time: 09/24/19  5:08 AM  Result Value Ref Range   Sodium 139 135 - 145 mmol/L   Potassium 4.1 3.5 - 5.1 mmol/L   Chloride 111 98 - 111 mmol/L   CO2 20 (L) 22 - 32 mmol/L   Glucose, Bld 99 70 - 99 mg/dL   BUN 9 6 - 20 mg/dL   Creatinine, Ser 6.38 0.44 - 1.00 mg/dL   Calcium 7.8 (L) 8.9 - 10.3 mg/dL   Total Protein 4.3 (L) 6.5 - 8.1 g/dL   Albumin 2.6 (L) 3.5 - 5.0 g/dL   AST 13 (L) 15 - 41 U/L   ALT 16 0 - 44 U/L   Alkaline Phosphatase 27 (L) 38 - 126 U/L   Total Bilirubin 0.3 0.3 - 1.2 mg/dL   GFR calc non Af Amer >60 >60 mL/min   GFR calc Af Amer  >60 >60 mL/min   Anion gap 8 5 - 15  DIC (disseminated intravasc coag) panel     Status: Abnormal (Preliminary result)   Collection Time: 09/24/19  5:08 AM  Result Value Ref Range   Prothrombin Time 15.0 11.4 - 15.2  seconds   INR 1.2 0.8 - 1.2   aPTT 30 24 - 36 seconds   Fibrinogen 154 (L) 210 - 475 mg/dL   D-Dimer, Quant <0.27 0.00 - 0.50 ug/mL-FEU   Platelets 210 150 - 400 K/uL   Smear Review PENDING   Type and screen     Status: None   Collection Time: 09/24/19  5:08 AM  Result Value Ref Range   ABO/RH(D) B POS    Antibody Screen NEG    Sample Expiration      09/27/2019,2359 Performed at [facility], [address], [place], [place] [address]     --/--/B POS (04/23 7867)  IMAGING US OB Comp Less 14 Wks  Result Date: 09/23/2019 CLINICAL DATA:  Bleeding, cramping EXAM: OBSTETRIC <14 WK [place] AND TRANSVAGINAL OB US TECHNIQUE: Both transabdominal and transvaginal ultrasound examinations were performed for complete evaluation of the gestation as well as the maternal uterus, adnexal regions, and pelvic cul-de-sac. Transvaginal technique was performed to assess early pregnancy. COMPARISON:  None. FINDINGS: Intrauterine gestational sac: Single, irregularly shaped at the level of the cervix. Yolk sac:  Not visualized Embryo:  Not visualized Cardiac Activity: Not visualized Heart Rate:   bpm MSD: 36.4 mm   8 w   6 d CRL:    mm    w    d                  [place] EDC: Subchorionic hemorrhage:  None visualized. Maternal uterus/adnexae: No adnexal mass or free fluid. IMPRESSION: Irregularly-shaped gestational sac at the level of the cervix. No fetal pole visualized. Findings meet definitive criteria for failed pregnancy. This follows SRU consensus guidelines: Diagnostic Criteria for Nonviable Pregnancy Early in the First Trimester. Macy Mis J Med [phone]. Electronically Signed   By: Charlett Nose M.D.   On: 09/23/2019 01:08   US OB Transvaginal  Result Date: 09/24/2019 CLINICAL DATA:   Initial evaluation for known SAB. EXAM: TRANSVAGINAL OB ULTRASOUND TECHNIQUE: Transvaginal ultrasound was performed for complete evaluation of the gestation as well as the maternal uterus, adnexal regions, and pelvic cul-de-sac. COMPARISON:  Prior ultrasound from 09/23/2019. FINDINGS: Intrauterine gestational sac: None now visualized. Endometrial stripe markedly thickened with associated increased vascularity, suggesting retained products. Yolk sac:  Negative. Embryo:  Negative. Cardiac Activity: Negative. Heart Rate: N/A bpm Subchorionic hemorrhage:  None visualized. Maternal uterus/adnexae: Not imaged on this exam.  No free fluid. IMPRESSION: Previously seen gestational sac no longer visualized. Endometrial stripe markedly thickened and heterogeneous with increased vascularity, compatible with retained products of conception. Electronically Signed   By: Rise Mu M.D.   On: 09/24/2019 05:54   US OB Transvaginal  Result Date: 09/23/2019 CLINICAL DATA:  Bleeding, cramping EXAM: OBSTETRIC <14 WK [place] AND TRANSVAGINAL OB US TECHNIQUE: Both transabdominal and transvaginal ultrasound examinations were performed for complete evaluation of the gestation as well as the maternal uterus, adnexal regions, and pelvic cul-de-sac. Transvaginal technique was performed to assess early pregnancy. COMPARISON:  None. FINDINGS: Intrauterine gestational sac: Single, irregularly shaped at the level of the cervix. Yolk sac:  Not visualized Embryo:  Not visualized Cardiac Activity: Not visualized Heart Rate:   bpm MSD: 36.4 mm   8 w   6 d CRL:    mm    w    d                  [place] EDC: Subchorionic hemorrhage:  None visualized. Maternal uterus/adnexae: No adnexal mass or free  fluid. IMPRESSION: Irregularly-shaped gestational sac at the level of the cervix. No fetal pole visualized. Findings meet definitive criteria for failed pregnancy. This follows SRU consensus guidelines: Diagnostic Criteria for Nonviable Pregnancy Early in  the First Trimester. Macy Mis J Med [phone]. Electronically Signed   By: Charlett Nose M.D.   On: 09/23/2019 01:08    MAU Management/MDM: Orders Placed This Encounter  Procedures  . Respiratory Panel by RT PCR (Flu A&B, Covid) - Nasopharyngeal Swab  . US OB Transvaginal  . CBC  . Comprehensive metabolic panel  . DIC (disseminated intravasc coag) panel  . Informed Consent Details: Physician/Practitioner Attestation; Transcribe to consent form and obtain patient signature  . CBC post transfusion - RN to place lab order with appropriate draw time  . Type and screen  . Prepare RBC (crossmatch)    Meds ordered this encounter  Medications  . sodium chloride 0.9 % bolus 1,000 mL  . lactated ringers bolus 1,000 mL  . 0.9 %  sodium chloride infusion  . HYDROmorphone (DILAUDID) injection 1 mg  . 0.9 %  sodium chloride infusion (Manually program via Guardrails IV Fluids)    Pt arrived with report from EMS of 1000 ml blood loss from home to arrival in MAU. Pt with 2 IV sites upon arrival with NS running.  Pt alert and oriented, but shaking and in distress upon arrival. Exam reveals large hemorrhage which slowed when POCs removed from cervical os but with continued gushes of 200 ml or more. Total blood loss estimated to be 1500 ml. Bedside US reveals markedly thickened endometrium with vascularity, c/w retained POCs. Hgb down from 11.4 on 4/21 to 7.5 today. Consult Dr Vergie Living. COVID swab collected, Type and crossmatch for 1 unit, Type and screen, DIC panel pending, CBC, CMP resulted.  Dr Vergie Living to bedside to see patient.     ASSESSMENT 1. Retained products of conception with hemorrhage   2. Vaginal bleeding in pregnancy, first trimester   3. Hemorrhage pregnancy, first trimester     PLAN Admit to OR   Sharen Counter Certified Nurse-Midwife 09/24/2019  6:21 AM

## 2019-09-24 NOTE — MAU Note (Signed)
Covid swab obtained without difficulty and pt tol well. No symptoms 

## 2019-09-24 NOTE — Discharge Instructions (Signed)
Dilation and Curettage or Vacuum Curettage, Care After This sheet gives you information about how to care for yourself after your procedure. Your health care provider may also give you more specific instructions. If you have problems or questions, contact your health care provider. What can I expect after the procedure? After your procedure, it is common to have:  Mild pain or cramping.  Some vaginal bleeding or spotting. These may last for up to 2 weeks after your procedure. Follow these instructions at home: Activity   Do not drive or use heavy machinery while taking prescription pain medicine.  Avoid driving for the first 24 hours after your procedure.  Take frequent, short walks, followed by rest periods, throughout the day. Ask your health care provider what activities are safe for you. After 1-2 days, you may be able to return to your normal activities.  Do not lift anything heavier than 10 lb (4.5 kg) until your health care provider approves.  For at least 2 weeks, or as long as told by your health care provider, do not: ? Douche. ? Use tampons. ? Have sexual intercourse. General instructions   Take over-the-counter and prescription medicines only as told by your health care provider. This is especially important if you take blood thinning medicine.  Do not take baths, swim, or use a hot tub until your health care provider approves. Take showers instead of baths.  Wear compression stockings as told by your health care provider. These stockings help to prevent blood clots and reduce swelling in your legs.  It is your responsibility to get the results of your procedure. Ask your health care provider, or the department performing the procedure, when your results will be ready.  Keep all follow-up visits as told by your health care provider. This is important. Contact a health care provider if:  You have severe cramps that get worse or that do not get better with  medicine.  You have severe abdominal pain.  You cannot drink fluids without vomiting.  You develop pain in a different area of your pelvis.  You have bad-smelling vaginal discharge.  You have a rash. Get help right away if:  You have vaginal bleeding that soaks more than one sanitary pad in 1 hour, for 2 hours in a row.  You pass large blood clots from your vagina.  You have a fever that is above 100.29F (38.0C).  Your abdomen feels very tender or hard.  You have chest pain.  You have shortness of breath.  You cough up blood.  You feel dizzy or light-headed.  You faint.  You have pain in your neck or shoulder area. This information is not intended to replace advice given to you by your health care provider. Make sure you discuss any questions you have with your health care provider. Document Revised: 05/02/2017 Document Reviewed: 12/21/2015 Elsevier Patient Education  2020 [employer]. Incomplete Miscarriage A miscarriage is the loss of an unborn baby (fetus) before the 20th week of pregnancy. In an incomplete miscarriage, parts of the fetus or placenta (afterbirth) remain in the body. Most miscarriages happen in the first 3 months of pregnancy. Sometimes, it happens before a woman even knows she is pregnant. Having a miscarriage can be an emotional experience. If you have had a miscarriage, talk with your health care provider about any questions you may have about miscarrying, the grieving process, and your future pregnancy plans. What are the causes? This condition may be caused by:  Problems with  the genes or chromosomes that make it impossible for the baby to develop normally. These problems are most often the result of random errors that occur early in development, and are not passed from parent to child (not inherited).  Infection of the cervix or uterus.  Conditions that affect hormone balance in the body.  Problems with the cervix, such as the cervix opening  and thinning before pregnancy is at term (cervical insufficiency).  Problems with the uterus, such as a uterus with an abnormal shape, fibroids in the uterus, or problems that were present from birth (congenital abnormalities).  Certain medical conditions.  Smoking, drinking alcohol, or using drugs.  Injury (trauma). Many times, the cause of a miscarriage is not known. What are the signs or symptoms? Symptoms of this condition include:  Vaginal bleeding or spotting, with or without cramps or pain.  Pain or cramping in the abdomen or lower back.  Passing fluid, tissue, or blood clots from the vagina. How is this diagnosed? This condition may be diagnosed based on:  A physical exam.  Ultrasound.  Blood tests.  Urine tests. How is this treated? An incomplete miscarriage may be treated with:  Dilation and curettage (D&C). This is a procedure in which the cervix is stretched open and the lining of the uterus (endometrium) is scraped to remove any remaining tissue from the pregnancy.  Medicines, such as: ? Antibiotic medicine to treat infection. ? Medicine to help any remaining tissue pass out of your uterus. ? Medicine to reduce (contract) the size of the uterus. These medicines may be given if you have a lot of bleeding. If you have Rh negative blood and your baby was Rh positive, you will need a shot of medicine called Rh immunoglobulinto protect future babies from Rh blood problems. "Rh-negative" and "Rh-positive" refer to whether or not the blood has a specific protein found on the surface of red blood cells (Rh factor). Follow these instructions at home: Medicines   Take over-the-counter and prescription medicines only as told by your health care provider.  If you were prescribed antibiotic medicine, take your antibiotic as told by your health care provider. Do not stop taking the antibiotic even if you start to feel better.  Do not take NSAIDs, such as aspirin and  ibuprofen, unless approved by your doctor. These medicines can cause bleeding. Activity  Rest as directed. Ask your health care provider what activities are safe for you.  Have someone help with home and family responsibilities during this time. General instructions  Keep track of the number of sanitary pads you use each day and how soaked (saturated) they are. Write down this information.  Monitor the amount of tissue or blood clots that you pass from your vagina. Save any large amounts of tissue for your health care provider to examine.  Do not use tampons, douche, or have sex until your health care provider approves.  To help you and your partner with the process of grieving, talk with your health care provider or seek counseling to help cope with the pregnancy loss.  When you are ready, meet with your health care provider to discuss important steps you should take for your health, as well as steps to take in order to have a healthy pregnancy in the future.  Keep all follow-up visits as told by your health care provider. This is important. Where to find more information  The American Congress of Obstetricians and Gynecologists: www.acog.org  U.[place]. Department of Health and Human  Services Office of Women's Health: [place] Contact a health care provider if:  You have a fever or chills.  You have a foul smelling vaginal discharge. Get help right away if:  You have severe cramps or pain in your back or abdomen.  You pass walnut-sized (or larger) blood clots or tissue from your vagina.  You have heavy bleeding, soaking more than 1 regular sanitary pad in an hour.  You become lightheaded or weak.  You pass out.  You have feelings of sadness that take over your thoughts, or you have thoughts of hurting yourself. Summary  In an incomplete miscarriage, parts of the fetus or placenta (afterbirth) remain in the body.  There are multiple treatment options for an  incomplete miscarriage, talk to your health care provider about the best option for you.  Follow your health care provider's instructions for follow-up care.  To help you and your partner with the process of grieving, talk with your health care provider or seek counseling to help cope with the pregnancy loss. This information is not intended to replace advice given to you by your health care provider. Make sure you discuss any questions you have with your health care provider. Document Revised: 06/26/2017 Document Reviewed: 06/26/2016 Elsevier Patient Education  2020 [employer].

## 2019-09-24 NOTE — MAU Note (Addendum)
Pt arrived by EMS to 123. Known SAB that was doing expectant management. STarted having vag bleeding and cramping 0300. EMS states she was sitting in approx 500cc blood when they arrived on scene. On arrival to hosp estimated she had then lost approx 1000cc. Iv NS infusing R AC with saline lock R hand. Pt alert and oriented. Running AC R with NS. SL R hand connected to 1000cc NS on admission and both running wide open. Pt alert and oriented on admission

## 2019-09-24 NOTE — Op Note (Signed)
Michelle Guzman  PROCEDURE DATE: 09/24/2019  PREOPERATIVE DIAGNOSIS: 8 week incomplete abortion.  POSTOPERATIVE DIAGNOSIS: The same.  PROCEDURE:    Suction Dilation and Evacuation.  SURGEON:  Reva Bores  ANESTHESIA: Val Eagle, MD  INDICATIONS: 34 y.o. G5P1122with MAB at [redacted] weeks gestation, who came in with hemorrhage and at least 1.5 L blood loss, needing surgical completion. She received 1 u PRBCs prior to OR. Risks of surgery were discussed with the patient including but not limited to: bleeding which may require transfusion; infection which may require antibiotics; injury to uterus or surrounding organs;need for additional procedures including laparotomy or laparoscopy; possibility of intrauterine scarring which may impair future fertility; and other postoperative/anesthesia complications. Written informed consent was obtained.    FINDINGS:  A 12 wk size anteverted uterus, moderate amounts of products of conception, specimen sent to pathology.  ANESTHESIA: GETT Val Eagle, MD, paracervical block.  ESTIMATED BLOOD LOSS:  Less than 20 ml intraoperatively.  FLUIDS: 2nd unit of PRBCs, 3L crystalloid, 1 albumin, 1 cryo  SPECIMENS:  Products of conception sent to pathology  COMPLICATIONS:  None immediate.  PROCEDURE DETAILS:  The patient received intravenous antibiotics while in the preoperative area.  She was then taken to the operating room where general anesthesia was administered and was found to be adequate.  After an adequate timeout was performed, she was placed in the dorsal lithotomy position and examined; then prepped and draped in the sterile manner.  Her bladder was catheterized for an unmeasured amount of clear, yellow urine. A vaginal speculum was then placed in the patient's vagina and a single tooth tenaculum was applied to the anterior lip of the cervix.  A paracervical block using 0.25% Marcaine with Epinephrine was administered. The cervix was already  dilated  And sounded to 11 cm. A 10 mm suction curette that was gently advanced to the uterine fundus. The suction device was then activated and curette slowly rotated to clear the uterus of products of conception. A sharp curettage was then performed to confirm complete emptying of the uterus.There was minimal bleeding noted and the tenaculum removed with good hemostasis noted. Bimanual massage confirmed that the uterus had firmed up. Minimal bleeding.  All insturmnent, needle and lap counts were correct x 2.The patient tolerated the procedure well. The patient was taken to the recovery area in stable condition.  Reva Bores 09/24/2019 9:32 AM

## 2019-09-24 NOTE — Anesthesia Preprocedure Evaluation (Signed)
Anesthesia Evaluation  Patient identified by MRN, date of birth, ID band Patient awake    Reviewed: Allergy & Precautions, NPO status , Patient's Chart, lab work & pertinent test results  History of Anesthesia Complications Negative for: history of anesthetic complications  Airway Mallampati: II  TM Distance: >3 FB     Dental  (+) Dental Advisory Given, Teeth Intact   Pulmonary neg recent URI,    breath sounds clear to auscultation       Cardiovascular negative cardio ROS   Rhythm:Regular     Neuro/Psych negative neurological ROS  negative psych ROS   GI/Hepatic negative GI ROS, Neg liver ROS,   Endo/Other  negative endocrine ROS  Renal/GU negative Renal ROS     Musculoskeletal   Abdominal   Peds  Hematology  (+) Blood dyscrasia, anemia , Acute blood loss anemia   Anesthesia Other Findings   Reproductive/Obstetrics (+) Pregnancy Missed ab                             Anesthesia Physical Anesthesia Plan  ASA: II  Anesthesia Plan: General   Post-op Pain Management:    Induction: Intravenous, Rapid sequence and Cricoid pressure planned  PONV Risk Score and Plan: 3 and Ondansetron and Dexamethasone  Airway Management Planned: Oral ETT  Additional Equipment: None  Intra-op Plan:   Post-operative Plan: Extubation in OR  Informed Consent: I have reviewed the patients History and Physical, chart, labs and discussed the procedure including the risks, benefits and alternatives for the proposed anesthesia with the patient or authorized representative who has indicated his/her understanding and acceptance.     Dental advisory given  Plan Discussed with: CRNA and Surgeon  Anesthesia Plan Comments:         Anesthesia Quick Evaluation

## 2019-09-24 NOTE — Transfer of Care (Signed)
Immediate Anesthesia Transfer of Care Note  Patient: Fredderick Erb  Procedure(s) Performed: SUCTION DILATATION AND CURETTAGE (N/A )  Patient Location: PACU  Anesthesia Type:General  Level of Consciousness: awake and patient cooperative  Airway & Oxygen Therapy: Patient Spontanous Breathing and Patient connected to nasal cannula oxygen  Post-op Assessment: Report given to RN and Post -op Vital signs reviewed and stable  Post vital signs: Reviewed and stable  Last Vitals:  Vitals Value Taken Time  BP 110/57 09/24/19 1025  Temp 37.1 C 09/24/19 1010  Pulse 99 09/24/19 1025  Resp 20 09/24/19 1025  SpO2 100 % 09/24/19 1025  Vitals shown include unvalidated device data.  Last Pain:  Vitals:   09/24/19 1010  TempSrc:   PainSc: (P) 0-No pain      Patients Stated Pain Goal: 0 (09/24/19 0450)  Complications: No apparent anesthesia complications

## 2019-09-24 NOTE — H&P (Addendum)
Obstetrics & Gynecology H&P   Date of Admission: 09/24/2019   Requesting Provider: Maternity Admissions Unit  Primary OBGYN: None Primary Care Provider: Patient, No Pcp Per  Reason for Admission: heavy VB with missed abortion, retained products of conception  History of Present Illness: Ms. Michelle Guzman is a 34 y.o. G5P2 (Patient's last menstrual period was 06/28/2019.), with the above CC. PMHx is significant for AMA.  Patient dx with missed AB at 8wks based on MSD early on 4/22 and elected for expectant management after presenting to the MAU with VB and pain. Patient presents today via EMS for heavy VB, with approximately 1L EBL per EMS and another here in MAU. Labs drawn and u/s done which showed rPOCs and I was called to come evaluate once u/s came back.  NPO since before midnight.   ROS: A 12-point review of systems was performed and negative, except as stated in the above HPI.  OBGYN History: As per HPI. OB History  Gravida Para Term Preterm AB Living  5 2 1 1 2 2   SAB TAB Ectopic Multiple Live Births  1 1     2     # Outcome Date GA Lbr Len/2nd Weight Sex Delivery Anes PTL Lv  5 Current           4 Preterm 01/06/09 [redacted]w[redacted]d   F Vag-Spont   LIV  3 Term 01/18/07 [redacted]w[redacted]d   F Vag-Spont   LIV  2 SAB  [redacted]w[redacted]d         1 TAB              Past Medical History: Past Medical History:  Diagnosis Date  . Medical history non-contributory     Past Surgical History: Past Surgical History:  Procedure Laterality Date  . NO PAST SURGERIES      Family History:  Family History  Problem Relation Age of Onset  . Lupus Mother   . Drug abuse Mother   . Hypertension Mother   . Mental illness Mother   . Drug abuse Father   . Heart disease Father     Social History:  Social History   Socioeconomic History  . Marital status: Single    Spouse name: Not on file  . Number of children: Not on file  . Years of education: Not on file  . Highest education level: Not on file   Occupational History  . Not on file  Tobacco Use  . Smoking status: Never Smoker  . Smokeless tobacco: Never Used  Substance and Sexual Activity  . Alcohol use: Not Currently  . Drug use: Not Currently    Types: Marijuana    Comment: stopped a couple weeks ago per pt on 09/23/19  . Sexual activity: Not Currently  Other Topics Concern  . Not on file  Social History Narrative  . Not on file   Social Determinants of Health   Financial Resource Strain:   . Difficulty of Paying Living Expenses:   Food Insecurity:   . Worried About [redacted]w[redacted]d in the Last Year:   . 09/25/19 in the Last Year:   Transportation Needs:   . Programme researcher, broadcasting/film/video (Medical):   Barista Lack of Transportation (Non-Medical):   Physical Activity:   . Days of Exercise per Week:   . Minutes of Exercise per Session:   Stress:   . Feeling of Stress :   Social Connections:   . Frequency of Communication with Friends and Family:   .  Frequency of Social Gatherings with Friends and Family:   . Attends Religious Services:   . Active Member of Clubs or Organizations:   . Attends Archivist Meetings:   Marland Kitchen Marital Status:   Intimate Partner Violence:   . Fear of Current or Ex-Partner:   . Emotionally Abused:   Marland Kitchen Physically Abused:   . Sexually Abused:      Allergy: Allergies  Allergen Reactions  . Orange Juice [Orange Oil] Swelling    Current Outpatient Medications: Medications Prior to Admission  Medication Sig Dispense Refill Last Dose  . Prenatal Vit-Fe Fumarate-FA (PRENATAL PO) Take by mouth.   09/23/2019 at Unknown time     Hospital Medications: Current Facility-Administered Medications  Medication Dose Route Frequency Provider Last Rate Last Admin  . 0.9 %  sodium chloride infusion (Manually program via Guardrails IV Fluids)   Intravenous Once Leftwich-Kirby, Lisa A, CNM      . 0.9 %  sodium chloride infusion   Intravenous Continuous Elvera Maria, CNM 999 mL/hr at  09/24/19 0617 New Bag at 09/24/19 0617     Physical Exam:   Current Vital Signs 24h Vital Sign Ranges  T 98.2 F (36.8 C) Temp  Avg: 98.2 F (36.8 C)  Min: 98.2 F (36.8 C)  Max: 98.2 F (36.8 C)  BP (!) 99/48 BP  Min: 74/55  Max: 133/43  HR 86 Pulse  Avg: 93.8  Min: 76  Max: 128  RR 20 Resp  Avg: 20  Min: 20  Max: 20  SaO2 100 %   SpO2  Avg: 100 %  Min: 100 %  Max: 100 %       24 Hour I/O Current Shift I/O  Time Ins Outs 04/22 0701 - 04/23 0700 In: -  Out: 1781  04/22 1901 - 04/23 0700 In: -  Out: 1781    Patient Vitals for the past 24 hrs:  BP Temp Pulse Resp SpO2  09/24/19 0600 (!) 99/48 -- 86 -- --  09/24/19 0555 (!) 101/46 -- 76 -- --  09/24/19 0550 (!) 108/47 -- 81 -- --  09/24/19 0546 (!) 98/50 -- 77 -- --  09/24/19 0536 (!) 133/43 -- 84 -- 100 %  09/24/19 0535 -- -- -- -- 100 %  09/24/19 0531 (!) 110/59 -- 88 -- --  09/24/19 0527 (!) 110/49 -- 90 -- --  09/24/19 0521 (!) 121/45 -- 100 -- --  09/24/19 0506 95/62 -- (!) 128 -- --  09/24/19 0452 (!) 74/55 -- (!) 102 -- --  09/24/19 0445 (!) 98/52 98.2 F (36.8 C) (!) 120 20 --   General appearance: Well nourished, well developed female, lethargic Cardiovascular: S1, S2 normal, no murmur, rub or gallop, regular rate and rhythm Respiratory:  Clear to auscultation bilateral. Normal respiratory effort Abdomen: positive bowel sounds and no masses, hernias; diffusely non tender to palpation, non distended Neuro/Psych:  Normal mood and affect.  Skin:  Warm and dry.  Extremities: no clubbing, cyanosis, or edema.   Pelvic exam: GU: no current VB, chux pad with apprixmately 2mL old blood on it.   Laboratory:  Recent Labs  Lab 09/22/19 2354 09/24/19 0508  WBC 5.9 7.4  HGB 11.4* 7.5*  HCT 35.4* 24.3*  PLT 268 210  213   Recent Labs  Lab 09/22/19 2354 09/24/19 0508  NA 138 139  K 3.9 4.1  CL 106 111  CO2 24 20*  BUN <5* 9  CREATININE 0.57 0.59  CALCIUM 9.4 7.8*  PROT 6.6 4.3*  BILITOT 0.4 0.3   ALKPHOS 39 27*  ALT 14 16  AST 13* 13*  GLUCOSE 91 99   Recent Labs  Lab 09/24/19 0508  APTT 30  INR 1.2   Recent Labs  Lab 09/24/19 0508  ABORH B POS   Fibrinogen 154  COVID Pending  Imaging:  CLINICAL DATA:  Initial evaluation for known SAB.  EXAM: TRANSVAGINAL OB ULTRASOUND  TECHNIQUE: Transvaginal ultrasound was performed for complete evaluation of the gestation as well as the maternal uterus, adnexal regions, and pelvic cul-de-sac.  COMPARISON:  Prior ultrasound from 09/23/2019.  FINDINGS: Intrauterine gestational sac: None now visualized. Endometrial stripe markedly thickened with associated increased vascularity, suggesting retained products.  Yolk sac:  Negative.  Embryo:  Negative.  Cardiac Activity: Negative.  Heart Rate: N/A bpm  Subchorionic hemorrhage:  None visualized.  Maternal uterus/adnexae: Not imaged on this exam.  No free fluid.  IMPRESSION: Previously seen gestational sac no longer visualized. Endometrial stripe markedly thickened and heterogeneous with increased vascularity, compatible with retained products of conception.   Electronically Signed   By: Rise Mu M.D.   On: 09/24/2019 05:54  Assessment: Ms. Michelle Guzman is a 34 y.o. G5P2, pt stable  Plan: I reviewed the images and nothing moving down in the lower uterine segment that can be removed in MAU so d/w pt and she was consented for suction d&c. One unit PRBC ordered for cross match and will transfuse when ready to give.   Pt with two IVs and IVF boluses going.   OR contacted for urgent case  Patient hopefully can go home from the PACU.   Cornelia Copa MD Attending Center for [facility])

## 2019-09-24 NOTE — Anesthesia Procedure Notes (Signed)
Procedure Name: Intubation Date/Time: 09/24/2019 9:06 AM Performed by: Adria Dill, CRNA Pre-anesthesia Checklist: Patient identified, Emergency Drugs available, Suction available and Patient being monitored Patient Re-evaluated:Patient Re-evaluated prior to induction Oxygen Delivery Method: Circle system utilized Preoxygenation: Pre-oxygenation with 100% oxygen Induction Type: IV induction and Rapid sequence Laryngoscope Size: Miller and 2 Grade View: Grade I Tube type: Oral Tube size: 7.0 mm Number of attempts: 1 Airway Equipment and Method: Stylet and Oral airway Placement Confirmation: ETT inserted through vocal cords under direct vision,  positive ETCO2 and breath sounds checked- equal and bilateral Secured at: 21 cm Tube secured with: Tape Dental Injury: Teeth and Oropharynx as per pre-operative assessment

## 2019-09-24 NOTE — Progress Notes (Signed)
I asked the patient when I initially saw her if she had any medical problems and she said "no". Patient now reports she has a vitamin K deficiency and needed a blood transfusion with a prior pregnancy; if had been known yesterday, she probably would not have been offered outpatient expectant management.  Her coags are currently normal. Will update problem list and continue to follow   Cornelia Copa MD Attending Center for [facility]) 09/24/2019 Time: [phone]

## 2019-09-25 LAB — PREPARE CRYOPRECIPITATE: Unit division: 0

## 2019-09-25 LAB — BPAM CRYOPRECIPITATE
Blood Product Expiration Date: 202104231510
ISSUE DATE / TIME: 202104230939
Unit Type and Rh: 5100

## 2019-09-26 LAB — BPAM RBC
Blood Product Expiration Date: 202105102359
Blood Product Expiration Date: 202105122359
Blood Product Expiration Date: 202105132359
Blood Product Expiration Date: 202105132359
Blood Product Expiration Date: 202105152359
ISSUE DATE / TIME: 202104230653
ISSUE DATE / TIME: 202104230828
ISSUE DATE / TIME: 202104230828
Unit Type and Rh: 7300
Unit Type and Rh: 7300
Unit Type and Rh: 7300
Unit Type and Rh: 7300
Unit Type and Rh: 7300

## 2019-09-26 LAB — TYPE AND SCREEN
ABO/RH(D): B POS
Antibody Screen: NEGATIVE
Unit division: 0
Unit division: 0
Unit division: 0
Unit division: 0
Unit division: 0

## 2019-09-27 LAB — SURGICAL PATHOLOGY

## 2019-09-27 NOTE — Anesthesia Postprocedure Evaluation (Signed)
Anesthesia Post Note  Patient: Michelle Guzman  Procedure(s) Performed: SUCTION DILATATION AND CURETTAGE (N/A )     Patient location during evaluation: PACU Anesthesia Type: General Level of consciousness: awake and alert Pain management: pain level controlled Vital Signs Assessment: post-procedure vital signs reviewed and stable Respiratory status: spontaneous breathing, nonlabored ventilation, respiratory function stable and patient connected to nasal cannula oxygen Cardiovascular status: blood pressure returned to baseline and stable Postop Assessment: no apparent nausea or vomiting Anesthetic complications: no    Last Vitals:  Vitals:   09/24/19 1125 09/24/19 1140  BP: (!) 96/59 100/69  Pulse: 80 94  Resp: 17 13  Temp:  37.1 C  SpO2: 100% 100%    Last Pain:  Vitals:   09/24/19 1140  TempSrc:   PainSc: 0-No pain                 Rahsaan Weakland

## 2019-10-13 ENCOUNTER — Ambulatory Visit: Payer: Self-pay | Admitting: Family Medicine

## 2019-10-13 ENCOUNTER — Encounter: Payer: Self-pay | Admitting: Family Medicine

## 2019-10-13 NOTE — Progress Notes (Signed)
Patient did not keep appointment today. She may call to reschedule.  

## 2020-06-08 ENCOUNTER — Encounter (HOSPITAL_COMMUNITY): Payer: Self-pay

## 2020-06-08 ENCOUNTER — Other Ambulatory Visit: Payer: Self-pay

## 2020-06-08 DIAGNOSIS — Z5321 Procedure and treatment not carried out due to patient leaving prior to being seen by health care provider: Secondary | ICD-10-CM | POA: Diagnosis not present

## 2020-06-08 DIAGNOSIS — R1030 Lower abdominal pain, unspecified: Secondary | ICD-10-CM | POA: Diagnosis not present

## 2020-06-08 NOTE — ED Triage Notes (Signed)
Pt arrives EMS with c/o suprapubic abdominal pain since 1500 tonight. Describes the pain as "pop rocks".

## 2020-06-09 ENCOUNTER — Emergency Department (HOSPITAL_COMMUNITY)
Admission: EM | Admit: 2020-06-09 | Discharge: 2020-06-09 | Disposition: A | Discharge status: Home / Self Care | Payer: Medicaid Other | Attending: Emergency Medicine | Admitting: Emergency Medicine

## 2020-06-09 LAB — URINALYSIS, ROUTINE W REFLEX MICROSCOPIC
Bilirubin Urine: NEGATIVE
Glucose, UA: NEGATIVE mg/dL
Hgb urine dipstick: NEGATIVE
Ketones, ur: NEGATIVE mg/dL
Leukocytes,Ua: NEGATIVE
Nitrite: NEGATIVE
Protein, ur: NEGATIVE mg/dL
Specific Gravity, Urine: 1.014 (ref 1.005–1.030)
pH: 9 — ABNORMAL HIGH (ref 5.0–8.0)

## 2020-06-09 LAB — CBC
HCT: 36.3 % (ref 36.0–46.0)
Hemoglobin: 11.7 g/dL — ABNORMAL LOW (ref 12.0–15.0)
MCH: 28.3 pg (ref 26.0–34.0)
MCHC: 32.2 g/dL (ref 30.0–36.0)
MCV: 87.9 fL (ref 80.0–100.0)
Platelets: 302 10*3/uL (ref 150–400)
RBC: 4.13 MIL/uL (ref 3.87–5.11)
RDW: 12.9 % (ref 11.5–15.5)
WBC: 6 10*3/uL (ref 4.0–10.5)
nRBC: 0 % (ref 0.0–0.2)

## 2020-06-09 LAB — COMPREHENSIVE METABOLIC PANEL
ALT: 11 U/L (ref 0–44)
AST: 12 U/L — ABNORMAL LOW (ref 15–41)
Albumin: 3.9 g/dL (ref 3.5–5.0)
Alkaline Phosphatase: 35 U/L — ABNORMAL LOW (ref 38–126)
Anion gap: 9 (ref 5–15)
BUN: 10 mg/dL (ref 6–20)
CO2: 25 mmol/L (ref 22–32)
Calcium: 8.7 mg/dL — ABNORMAL LOW (ref 8.9–10.3)
Chloride: 106 mmol/L (ref 98–111)
Creatinine, Ser: 0.7 mg/dL (ref 0.44–1.00)
GFR, Estimated: >60 mL/min (ref >60)
Glucose, Bld: 95 mg/dL (ref 70–99)
Potassium: 3.6 mmol/L (ref 3.5–5.1)
Sodium: 140 mmol/L (ref 135–145)
Total Bilirubin: 0.8 mg/dL (ref 0.3–1.2)
Total Protein: 6.5 g/dL (ref 6.5–8.1)

## 2020-06-09 LAB — I-STAT BETA HCG BLOOD, ED (MC, WL, AP ONLY): I-stat hCG, quantitative: <5 m[IU]/mL (ref <5)

## 2020-06-09 LAB — LIPASE, BLOOD: Lipase: 41 U/L (ref 11–51)

## 2020-06-09 NOTE — ED Notes (Signed)
Pt called 3x for room placement. Eloped from waiting area.  

## 2021-03-25 IMAGING — US US OB TRANSVAGINAL
1 series · 15 of 17 positions shown · non-contrast
Comparison: Prior ultrasound from 09/23/2019.

CLINICAL DATA: Initial evaluation for known SAB.

EXAM:
TRANSVAGINAL OB ULTRASOUND
TECHNIQUE: Transvaginal ultrasound was performed for complete evaluation of the
gestation as well as the maternal uterus, adnexal regions, and
pelvic cul-de-sac.

[Series 1: us ob transvaginal · 15 of 17 slices shown]
[im 1/17]
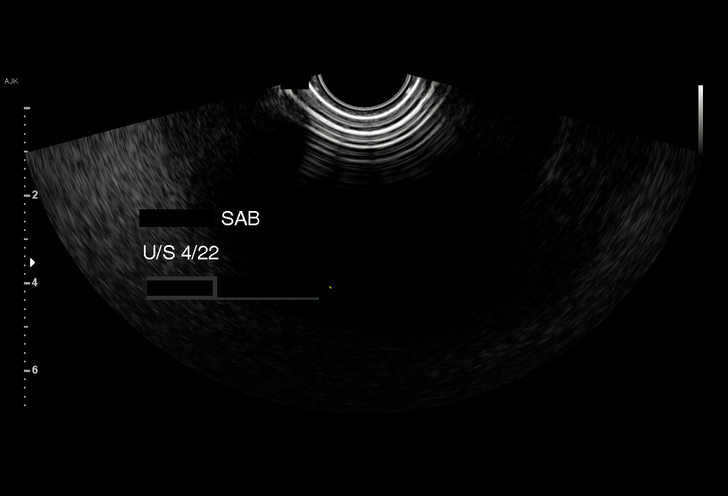
[im 2/17]
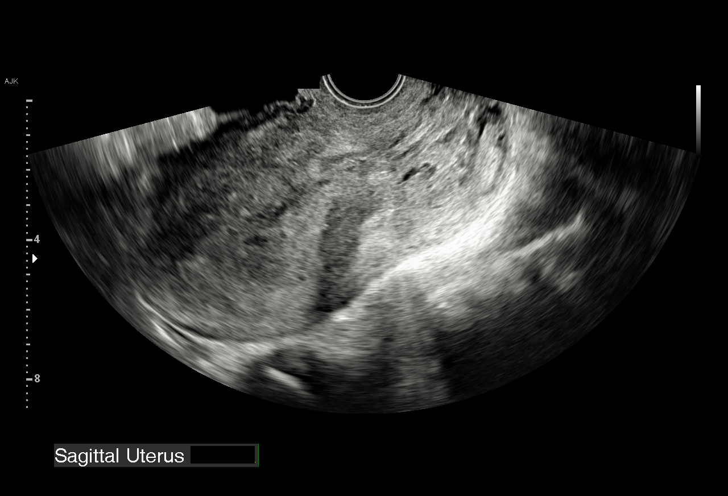
[im 3/17]
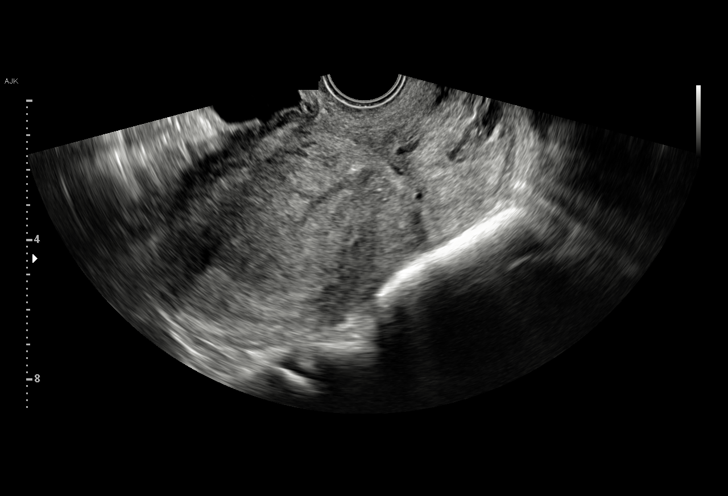
[im 4/17]
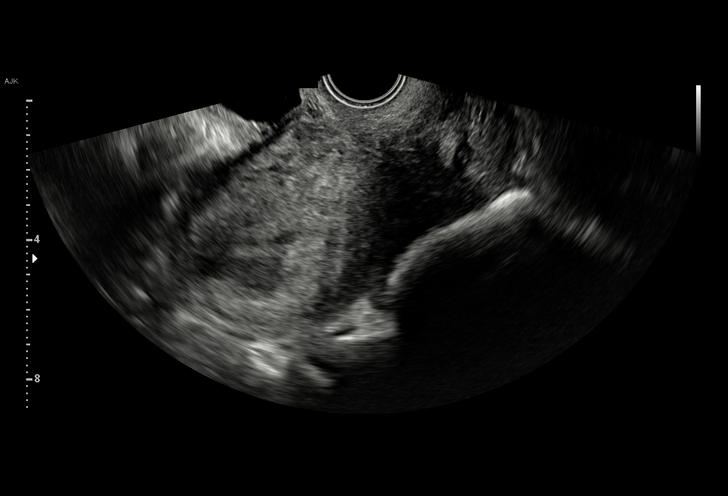
[im 6/17]
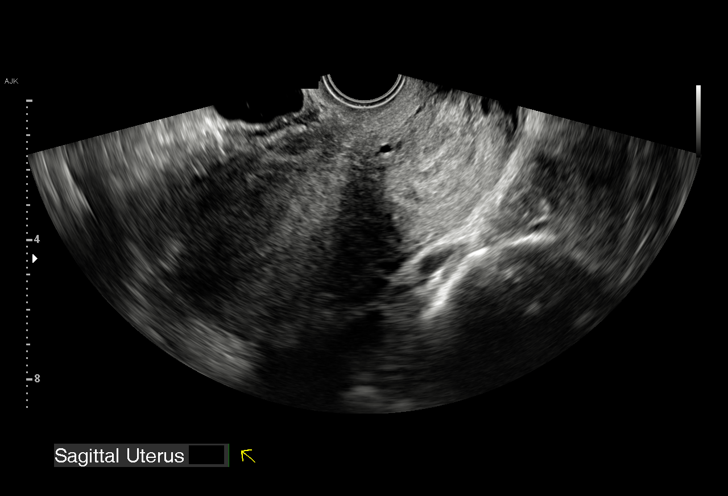
[im 7/17]
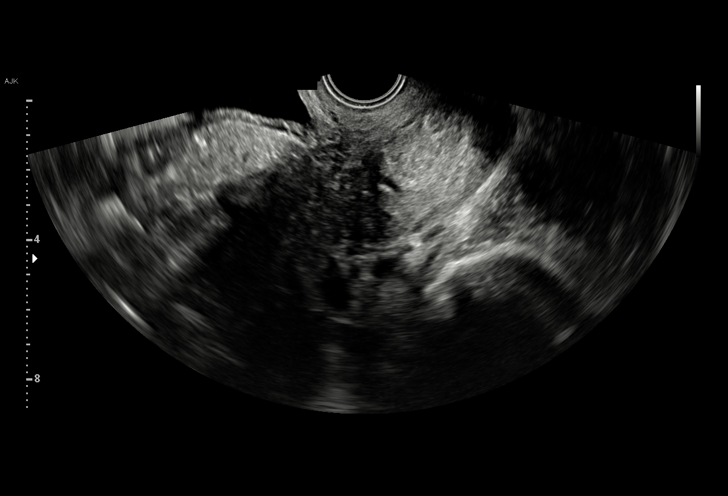
[im 8/17]
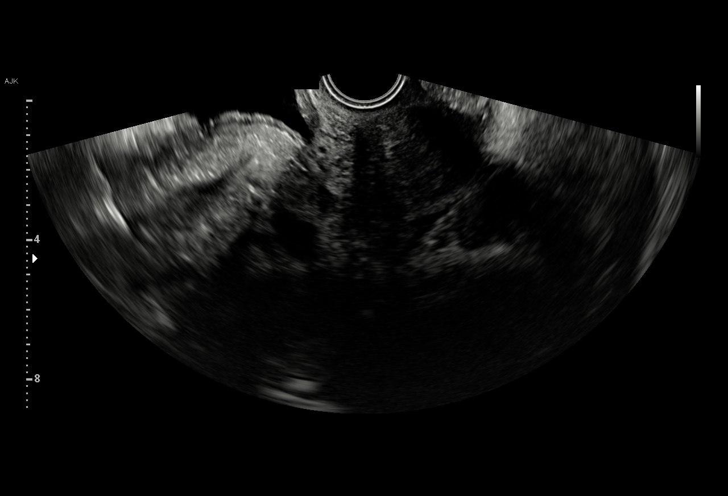
[im 9/17]
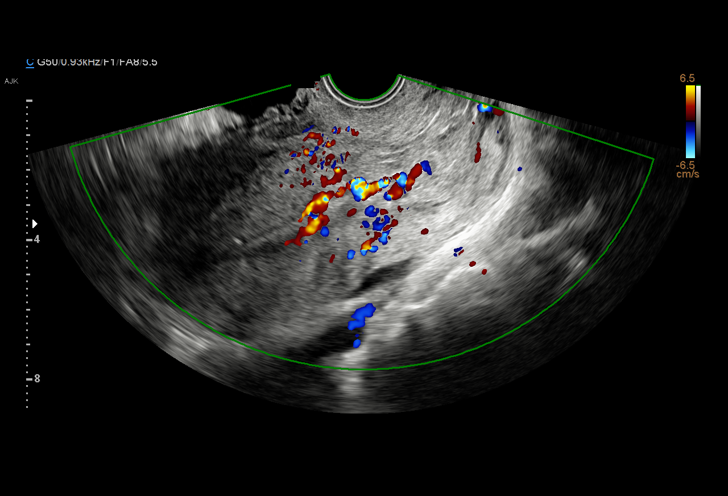
[im 10/17]
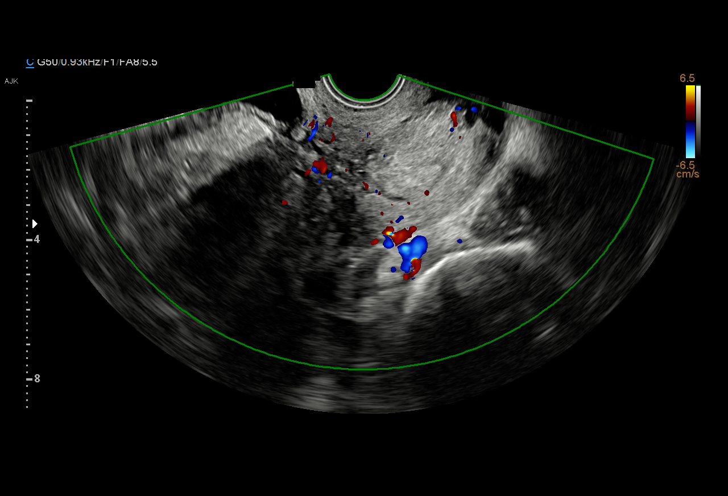
[im 11/17]
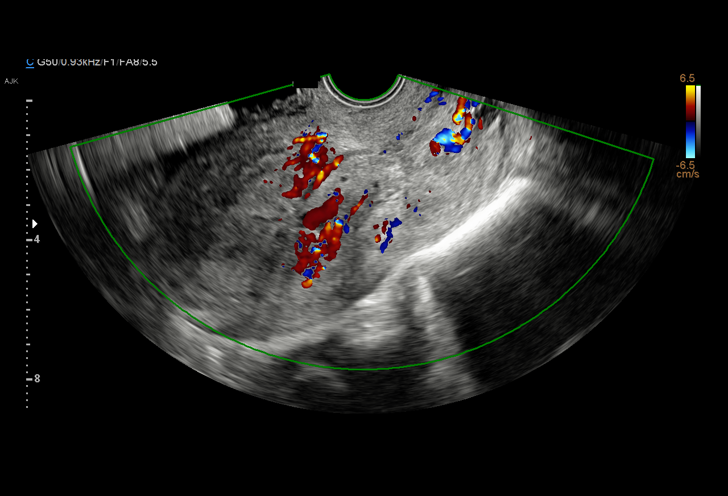
[im 12/17]
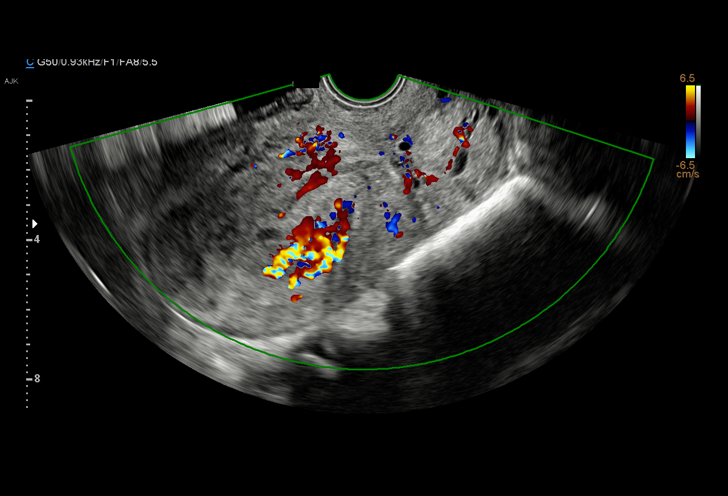
[im 14/17]
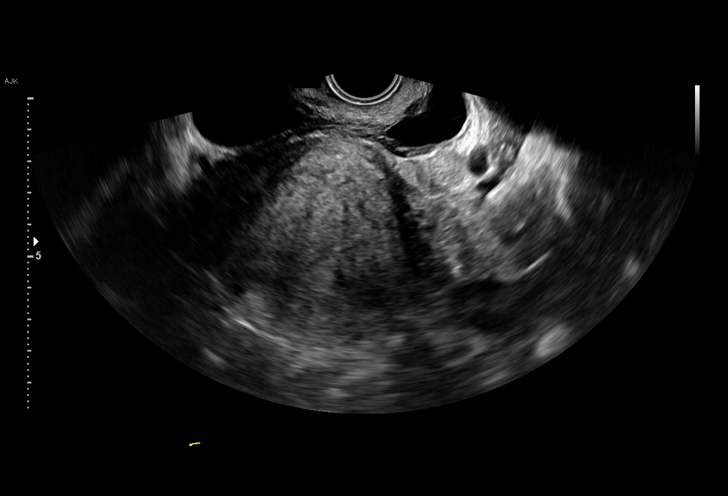
[im 15/17]
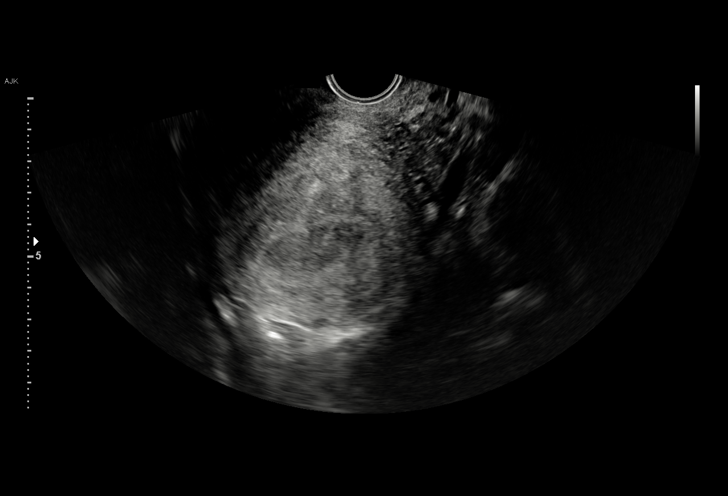
[im 16/17]
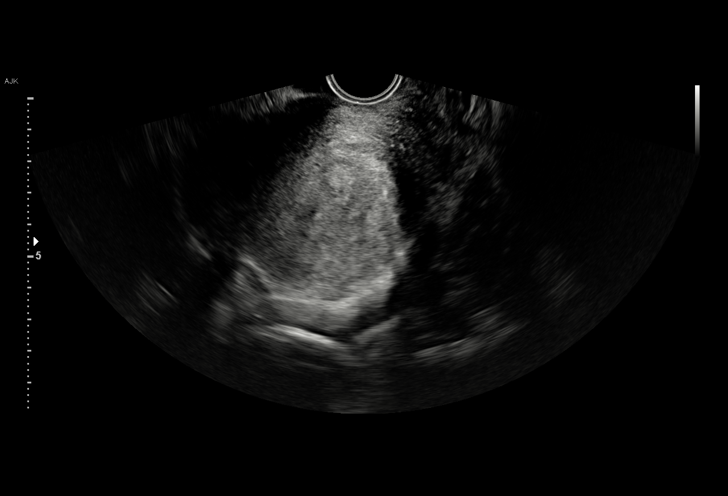
[im 17/17]
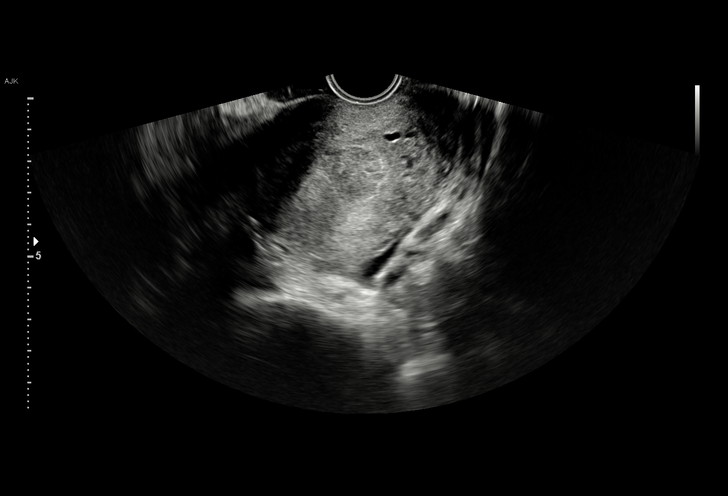

[15 of 17 positions shown; findings below may reference images not displayed]

FINDINGS: Intrauterine gestational sac: None now visualized. Endometrial
stripe markedly thickened with associated increased vascularity,
suggesting retained products.

Yolk sac:  Negative.

Embryo:  Negative.

Cardiac Activity: Negative.

Heart Rate: N/A bpm

Subchorionic hemorrhage:  None visualized.

Maternal uterus/adnexae: Not imaged on this exam.  No free fluid.
IMPRESSION: Previously seen gestational sac no longer visualized. Endometrial
stripe markedly thickened and heterogeneous with increased
vascularity, compatible with retained products of conception.

## 2021-06-03 NOTE — L&D Delivery Note (Signed)
Delivery Note At 12:58 AM a viable and healthy female was delivered via Vaginal, Spontaneous (Presentation:   Occiput Anterior). Head delivered with pt in hands-and-knees position. Resident unable to deliver body with gentle upward traction. CNM attempted delivery and was also unsuccessful. Pt turned to semi fowlers. No shoulder dystocia encountered. Anterior shoulder delivered but body still not able to be delivered. CNM grasped  anterior shoulder and rotated it to posterior position and was able to deliver the body slowly via corkscrew maneuver. Infant placed skin-to-skin w/ mom. Delayed cord clamping x 15 minutes per pt and FOB preference. Cord clamped x 2 and cut by pt. APGAR: 7, 9; weight 7 lb 13 oz (3544 g).   Placenta status: Spontaneous, Intact.  Cord: 3 vessels with the following complications: None.  Cord pH: NA  Anesthesia: None Episiotomy: None Lacerations: First degree vaginal  Suture Repair:  NA Est. Blood Loss (mL): 252  Mom to postpartum.  Baby to Couplet care / Skin to Skin. Placenta to: L&D Feeding: Breast Circ: NA Contraception: Undecided  [place] 03/24/2022, 9:07 AM

## 2021-08-19 ENCOUNTER — Inpatient Hospital Stay (HOSPITAL_COMMUNITY): Payer: Medicaid Other

## 2021-08-19 ENCOUNTER — Encounter (HOSPITAL_COMMUNITY): Payer: Self-pay

## 2021-08-19 ENCOUNTER — Other Ambulatory Visit: Payer: Self-pay

## 2021-08-19 ENCOUNTER — Inpatient Hospital Stay (HOSPITAL_COMMUNITY)
Admission: AD | Admit: 2021-08-19 | Discharge: 2021-08-19 | Disposition: A | Discharge status: Home / Self Care | Payer: Medicaid Other | Attending: Family Medicine | Admitting: Family Medicine

## 2021-08-19 DIAGNOSIS — Z3A01 Less than 8 weeks gestation of pregnancy: Secondary | ICD-10-CM | POA: Insufficient documentation

## 2021-08-19 DIAGNOSIS — Z3491 Encounter for supervision of normal pregnancy, unspecified, first trimester: Secondary | ICD-10-CM

## 2021-08-19 DIAGNOSIS — O26891 Other specified pregnancy related conditions, first trimester: Secondary | ICD-10-CM | POA: Diagnosis present

## 2021-08-19 DIAGNOSIS — K5901 Slow transit constipation: Secondary | ICD-10-CM | POA: Insufficient documentation

## 2021-08-19 LAB — URINALYSIS, ROUTINE W REFLEX MICROSCOPIC
Bilirubin Urine: NEGATIVE
Glucose, UA: NEGATIVE mg/dL
Hgb urine dipstick: NEGATIVE
Ketones, ur: NEGATIVE mg/dL
Leukocytes,Ua: NEGATIVE
Nitrite: NEGATIVE
Protein, ur: NEGATIVE mg/dL
Specific Gravity, Urine: 1.009 (ref 1.005–1.030)
pH: 8 (ref 5.0–8.0)

## 2021-08-19 LAB — HCG, QUANTITATIVE, PREGNANCY: hCG, Beta Chain, Quant, S: 144345 m[IU]/mL — ABNORMAL HIGH (ref <5)

## 2021-08-19 LAB — WET PREP, GENITAL
Sperm: NONE SEEN
Trich, Wet Prep: NONE SEEN
WBC, Wet Prep HPF POC: <10 (ref <10)
Yeast Wet Prep HPF POC: NONE SEEN

## 2021-08-19 LAB — CBC
HCT: 33.9 % — ABNORMAL LOW (ref 36.0–46.0)
Hemoglobin: 11.2 g/dL — ABNORMAL LOW (ref 12.0–15.0)
MCH: 28.7 pg (ref 26.0–34.0)
MCHC: 33 g/dL (ref 30.0–36.0)
MCV: 86.9 fL (ref 80.0–100.0)
Platelets: 357 10*3/uL (ref 150–400)
RBC: 3.9 MIL/uL (ref 3.87–5.11)
RDW: 12.6 % (ref 11.5–15.5)
WBC: 6.4 10*3/uL (ref 4.0–10.5)
nRBC: 0 % (ref 0.0–0.2)

## 2021-08-19 LAB — POCT PREGNANCY, URINE: Preg Test, Ur: POSITIVE — AB

## 2021-08-19 MED ORDER — ACETAMINOPHEN 500 MG PO TABS
1000.0000 mg | ORAL_TABLET | Freq: Four times a day (QID) | ORAL | Status: DC | PRN
  Administered 2021-08-19: 1000 mg via ORAL
  Filled 2021-08-19: qty 2

## 2021-08-19 NOTE — MAU Provider Note (Signed)
?History  ?  ? ?CSN: [id number] ? ?Arrival date and time: 08/19/21 1159 ? ? Event Date/Time  ? First Provider Initiated Contact with Patient 08/19/21 1238   ?  ? ?Chief Complaint  ?Patient presents with  ? Abdominal Pain  ? ?36 year old G6 P1-1-3-2 at 7.4 weeks by sure LMP presenting with left lower quadrant pain since yesterday.  Rates pain 8 out of 10, describes as constant and cramping and becomes sharp with movement.  Denies vaginal bleeding and reports increased vaginal discharge.  Denies itching or malodor.  No concern for STDs.  Denies urinary symptoms.  Reports history of constipation but had recent bowel movement. ? ?OB History   ? ? Gravida  ?6  ? Para  ?2  ? Term  ?1  ? Preterm  ?1  ? AB  ?3  ? Living  ?2  ?  ? ? SAB  ?2  ? IAB  ?1  ? Ectopic  ?   ? Multiple  ?   ? Live Births  ?2  ?   ?  ?  ? ? ?Past Medical History:  ?Diagnosis Date  ? Medical history non-contributory   ? Vitamin K deficiency coagulation disorder (HCC)   ? ? ?Past Surgical History:  ?Procedure Laterality Date  ? DILATION AND CURETTAGE OF UTERUS N/A 09/24/2019  ? Procedure: SUCTION DILATATION AND CURETTAGE;  Surgeon: Reva Bores, MD;  Location: [facility] OR;  Service: Gynecology;  Laterality: N/A;  ? NO PAST SURGERIES    ? ? ?Family History  ?Problem Relation Age of Onset  ? Lupus Mother   ? Drug abuse Mother   ? Hypertension Mother   ? Mental illness Mother   ? Drug abuse Father   ? Heart disease Father   ? ? ?Social History  ? ?Tobacco Use  ? Smoking status: Never  ? Smokeless tobacco: Never  ?Substance Use Topics  ? Alcohol use: Not Currently  ? Drug use: Not Currently  ?  Types: Marijuana  ?  Comment: stopped a couple weeks ago per pt on 09/23/19  ? ? ?Allergies:  ?Allergies  ?Allergen Reactions  ? Orange Juice [Orange Oil] Swelling  ? ? ?Medications Prior to Admission  ?Medication Sig Dispense Refill Last Dose  ? ibuprofen (ADVIL) 600 MG tablet Take 1 tablet (600 mg total) by mouth every 6 (six) hours as needed. 30 tablet 1   ? Prenatal  Vit-Fe Fumarate-FA (PRENATAL PO) Take by mouth.     ? ? ?Review of Systems  ?Constitutional:  Negative for chills and fever.  ?Gastrointestinal:  Positive for abdominal pain. Negative for constipation and diarrhea.  ?Genitourinary:  Positive for vaginal discharge. Negative for dysuria, frequency, urgency and vaginal bleeding.  ?Physical Exam  ? ?Blood pressure 123/75, pulse 90, temperature 98.7 ?F (37.1 ?C), temperature source Oral, resp. rate 16, height 5' (1.524 m), weight 50.3 kg, last menstrual period 06/27/2021, SpO2 100 %, unknown if currently breastfeeding. ? ?Physical Exam ?Constitutional:   ?   General: She is not in acute distress. ?   Appearance: Normal appearance.  ?HENT:  ?   Head: Normocephalic and atraumatic.  ?Cardiovascular:  ?   Rate and Rhythm: Normal rate.  ?Abdominal:  ?   General: There is no distension.  ?   Palpations: Abdomen is soft. There is no mass.  ?   Tenderness: There is no abdominal tenderness. There is no guarding or rebound.  ?   Hernia: No hernia is present.  ?Musculoskeletal:     ?  General: Normal range of motion.  ?   Cervical back: Normal range of motion.  ?Skin: ?   General: Skin is warm and dry.  ?Neurological:  ?   General: No focal deficit present.  ?   Mental Status: She is alert and oriented to person, place, and time.  ?Psychiatric:     ?   Mood and Affect: Mood normal.     ?   Behavior: Behavior normal.  ? ?Results for orders placed or performed during the hospital encounter of 08/19/21 (from the past 24 hour(s))  ?Pregnancy, urine POC     Status: Abnormal  ? Collection Time: 08/19/21 12:12 PM  ?Result Value Ref Range  ? Preg Test, Ur POSITIVE (A) NEGATIVE  ?Urinalysis, Routine w reflex microscopic Urine, Clean Catch     Status: Abnormal  ? Collection Time: 08/19/21 12:17 PM  ?Result Value Ref Range  ? Color, Urine YELLOW YELLOW  ? APPearance CLOUDY (A) CLEAR  ? Specific Gravity, Urine 1.009 1.005 - 1.030  ? pH 8.0 5.0 - 8.0  ? Glucose, UA NEGATIVE NEGATIVE mg/dL  ?  Hgb urine dipstick NEGATIVE NEGATIVE  ? Bilirubin Urine NEGATIVE NEGATIVE  ? Ketones, ur NEGATIVE NEGATIVE mg/dL  ? Protein, ur NEGATIVE NEGATIVE mg/dL  ? Nitrite NEGATIVE NEGATIVE  ? Leukocytes,Ua NEGATIVE NEGATIVE  ?CBC     Status: Abnormal  ? Collection Time: 08/19/21 12:29 PM  ?Result Value Ref Range  ? WBC 6.4 4.0 - 10.5 K/uL  ? RBC 3.90 3.87 - 5.11 MIL/uL  ? Hemoglobin 11.2 (L) 12.0 - 15.0 g/dL  ? HCT 33.9 (L) 36.0 - 46.0 %  ? MCV 86.9 80.0 - 100.0 fL  ? MCH 28.7 26.0 - 34.0 pg  ? MCHC 33.0 30.0 - 36.0 g/dL  ? RDW 12.6 11.5 - 15.5 %  ? Platelets 357 150 - 400 K/uL  ? nRBC 0.0 0.0 - 0.2 %  ?hCG, quantitative, pregnancy     Status: Abnormal  ? Collection Time: 08/19/21 12:29 PM  ?Result Value Ref Range  ? hCG, Beta Chain, Quant, S 144,345 (H) <5 mIU/mL  ?Wet prep, genital     Status: Abnormal  ? Collection Time: 08/19/21 12:40 PM  ? Specimen: PATH Cytology Cervicovaginal Ancillary Only  ?Result Value Ref Range  ? Yeast Wet Prep HPF POC NONE SEEN NONE SEEN  ? Trich, Wet Prep NONE SEEN NONE SEEN  ? Clue Cells Wet Prep HPF POC PRESENT (A) NONE SEEN  ? WBC, Wet Prep HPF POC <10 <10  ? Sperm NONE SEEN   ? ?US OB Comp Less 14 Wks ? ?Result Date: 08/19/2021 ?CLINICAL DATA:  Abdominal pain. Quantitative beta hCG pending. Last menstrual period 06/27/2021 which would correspond to estimated gestational age of [redacted] weeks 4 days. EXAM: OBSTETRIC <14 WK ULTRASOUND TECHNIQUE: Transabdominal ultrasound was performed for evaluation of the gestation as well as the maternal uterus and adnexal regions. COMPARISON:  No prior Ob ultrasound for this pregnancy. FINDINGS: Intrauterine gestational sac: Single Yolk sac:  Visualized. Embryo:  Visualized. Cardiac Activity: Visualized. Heart Rate: 171 bpm CRL:   15.8 mm mm   7 w 6 d                  US EDC: 04/01/2022 Subchorionic hemorrhage: Small hypoechoic subchorionic hemorrhage measuring up to approximately 10 mm. Maternal uterus/adnexae: Within normal limits. No free fluid is seen.  IMPRESSION: 1. Single live intrauterine pregnancy with crown-rump length corresponding to estimated gestational age of [redacted] weeks 6  days. This agrees with the last menstrual period dating of 7 weeks 4 days. 2. Small subchorionic hemorrhage measuring approximately 10 mm. Recommend attention on follow-up. Electronically Signed   By: Neita Garnet M.D.   On: 08/19/2021 13:34   ? ?MAU Course  ?Procedures ? ?MDM ?Labs and ultrasound ordered and reviewed. Viable IUP on [place], consistent with dates. Discussed treatment for constipation. Stable for discharge home. ? ?Assessment and Plan  ? ?1. [redacted] weeks gestation of pregnancy   ?2. Slow transit constipation   ?3. Normal intrauterine pregnancy on prenatal ultrasound in first trimester   ? ?Discharge home ?Follow-up with [facility] as scheduled ?SAB precautions ? ?Allergies as of 08/19/2021   ? ?   Reactions  ? Orange Juice [orange Oil] Swelling  ? ?  ? ?  ?Medication List  ?  ? ?STOP taking these medications   ? ?ibuprofen 600 MG tablet ?Commonly known as: ADVIL ?  ? ?  ? ?TAKE these medications   ? ?PRENATAL PO ?Take by mouth. ?  ? ?  ? ? ?Donette Larry, CNM ?08/19/2021, 1:55 PM  ?

## 2021-08-19 NOTE — MAU Note (Signed)
Michelle Guzman is a 36 y.o. at [redacted]w[redacted]d here in MAU reporting: constant lower abdominal pain since yesterday and it has gotten. Pain is more on the left side. No bleeding or abnormal discharge. ? ?LMP: 06/27/21 ? ?Onset of complaint: yesterday ? ?Pain score: 8/10 ? ?Vitals:  ? 08/19/21 1220  ?BP: 123/75  ?Pulse: 90  ?Resp: 16  ?Temp: 98.7 ?F (37.1 ?C)  ?SpO2: 100%  ?   ?Lab orders placed from triage: ua, upt ? ?

## 2021-08-19 NOTE — Discharge Instructions (Signed)

## 2021-08-20 LAB — GC/CHLAMYDIA PROBE AMP (~~LOC~~) NOT AT ARMC
Chlamydia: NEGATIVE
Comment: NEGATIVE
Comment: NORMAL
Neisseria Gonorrhea: NEGATIVE

## 2021-08-30 ENCOUNTER — Telehealth (INDEPENDENT_AMBULATORY_CARE_PROVIDER_SITE_OTHER): Payer: Medicaid Other

## 2021-08-30 DIAGNOSIS — O0990 Supervision of high risk pregnancy, unspecified, unspecified trimester: Secondary | ICD-10-CM | POA: Insufficient documentation

## 2021-08-30 DIAGNOSIS — O208 Other hemorrhage in early pregnancy: Secondary | ICD-10-CM

## 2021-08-30 DIAGNOSIS — Z3A00 Weeks of gestation of pregnancy not specified: Secondary | ICD-10-CM

## 2021-08-30 DIAGNOSIS — O418X10 Other specified disorders of amniotic fluid and membranes, first trimester, not applicable or unspecified: Secondary | ICD-10-CM | POA: Insufficient documentation

## 2021-08-30 DIAGNOSIS — O09529 Supervision of elderly multigravida, unspecified trimester: Secondary | ICD-10-CM | POA: Insufficient documentation

## 2021-08-30 DIAGNOSIS — O0940 Supervision of pregnancy with grand multiparity, unspecified trimester: Secondary | ICD-10-CM

## 2021-08-30 DIAGNOSIS — O09521 Supervision of elderly multigravida, first trimester: Secondary | ICD-10-CM

## 2021-08-30 HISTORY — DX: Other specified disorders of amniotic fluid and membranes, first trimester, not applicable or unspecified: O41.8X10

## 2021-08-30 HISTORY — DX: Other hemorrhage in early pregnancy: O20.8

## 2021-08-30 MED ORDER — BLOOD PRESSURE MONITORING DEVI: 1.0000 | 0 refills | Status: DC

## 2021-08-30 NOTE — Patient Instructions (Signed)
?Safe Medications in Pregnancy  ? ?Acne:  ?Benzoyl Peroxide  ?Salicylic Acid  ? ?Backache/Headache:  ?Tylenol: 2 regular strength every 4 hours OR  ?             2 Extra strength every 6 hours  ? ?Colds/Coughs/Allergies:  ?Benadryl (alcohol free) 25 mg every 6 hours as needed  ?Breath right strips  ?Claritin  ?Cepacol throat lozenges  ?Chloraseptic throat spray  ?Cold-Eeze- up to three times per day  ?Cough drops, alcohol free  ?Flonase (by prescription only)  ?Guaifenesin  ?Mucinex  ?Robitussin DM (plain only, alcohol free)  ?Saline nasal spray/drops  ?Sudafed (pseudoephedrine) & Actifed * use only after [redacted] weeks gestation and if you do not have high blood pressure  ?Tylenol  ?Vicks Vaporub  ?Zinc lozenges  ?Zyrtec  ? ?Constipation:  ?Colace  ?Ducolax suppositories  ?Fleet enema  ?Glycerin suppositories  ?Metamucil  ?Milk of magnesia  ?Miralax  ?Senokot  ?Smooth move tea  ? ?Diarrhea:  ?Kaopectate  ?Imodium A-D  ? ?*NO pepto Bismol  ? ?Hemorrhoids:  ?Anusol  ?Anusol HC  ?Preparation H  ?Tucks  ? ?Indigestion:  ?Tums  ?Maalox  ?Mylanta  ?Zantac  ?Pepcid  ? ?Insomnia:  ?Benadryl (alcohol free) 25mg every 6 hours as needed  ?Tylenol PM  ?Unisom, no Gelcaps  ? ?Leg Cramps:  ?Tums  ?MagGel  ? ?Nausea/Vomiting:  ?Bonine  ?Dramamine  ?Emetrol  ?Ginger extract  ?Sea bands  ?Meclizine  ?Nausea medication to take during pregnancy:  ?Unisom (doxylamine succinate 25 mg tablets) Take one tablet daily at bedtime. If symptoms are not adequately controlled, the dose can be increased to a maximum recommended dose of two tablets daily (1/2 tablet in the morning, 1/2 tablet mid-afternoon and one at bedtime).  ?Vitamin B6 100mg tablets. Take one tablet twice a day (up to 200 mg per day).  ? ?Skin Rashes:  ?Aveeno products  ?Benadryl cream or 25mg every 6 hours as needed  ?Calamine Lotion  ?1% cortisone cream  ? ?Yeast infection:  ?Gyne-lotrimin 7  ?Monistat 7  ? ? ?**If taking multiple medications, please check labels to avoid  duplicating the same active ingredients  ?**take medication as directed on the label  ?** Do not exceed 4000 mg of tylenol in 24 hours  ?**Do not take medications that contain aspirin or ibuprofen  ?    ?   ? AREA PEDIATRIC/FAMILY PRACTICE PHYSICIANS ? ?[place] ([address]) ?[facility] ?Chambliss, MD; Eniola, MD; Hale, MD; Hensel, MD; McDiarmid, MD; McIntyer, MD; Woods Gangemi, MD; Walden, MD ?[address] [place], [place] [address] ?[phone] ?Mon-Fri 8:30-12:30, 1:30-5:00 ?Providers come to see babies at [facility] ?Accepting Medicaid ?[facility] ?Limited providers who accept newborns: Koirala, MD; Morrow, MD; Wolters, MD ?[address], [place], [place] [address] ?[phone] ?Mon-Fri 8:00-5:30 ?Babies seen by providers at [facility] ?Does NOT accept Medicaid ?Please call early in hospitalization for appointment (limited availability)  ?[facility] ?Mulberry, MD ?[address] [place], [place] [address] ?[phone] ?Mon, Tue, Thur, Fri 8:30-5:00, Wed 10:00-7:00 (closed 1-2pm) ?Babies seen by [facility] providers ?Accepting Medicaid ?[facility] ?Rubin, MD ?[address], [place],  [address] ?[phone] ?Mon-Fri 8:30-5:00, Sat 8:30-12:00 ?Provider comes to see babies at [facility] ?Accepting Medicaid ?Must have been referred from current patients or contacted office prior to delivery ?[facility] ([facility]) ?Brown, MD; Chandler, MD; Ettefagh,   MD; Grant, MD; Lester, MD; McCormick, MD; McQueen, MD; Prose, MD; Simha, MD; Stanley, MD; Stryffeler, NP; Tebben, NP ?[address], [place], [place] [address] ?[phone] ?Mon, Tue, Thur, Fri 8:30-5:30, Wed 9:30-5:30, Sat 8:30-12:30 ?Babies seen by [facility] providers ?Accepting Medicaid ?Only accepting infants of first-time parents or  siblings of current patients ?Hospital discharge coordinator will make follow-up appointment ?Jack Amos ?[address], [place], [place]  [address] ?[phone]   Fax - [phone] ?[facility] ?[address], [place], [place]  [address] ?Phone - [phone]   Fax - [phone] ?Shilpa Gosrani ?[address], [place], [place]  [address] ?[phone] ? ?[place] ([id number]) ?[facility] ?Bates, MD; Brassfield, MD; Cooper, Cox, MD; MD; Davis, MD; Dovico, MD; Ettefaugh, MD; Little, MD; Lowe, MD; Keiffer, MD; Melvin, MD; Sumner, MD; Williams, MD ?[address], [place], [place] [address] ?[phone] ?Mon-Fri 8:30-5:00 (extended evenings Mon-Thur as needed), Sat-Sun 10:00-1:00 ?Providers come to see babies at [facility] ?Accepting Medicaid for families of first-time babies and families with all children in the household age 3 and under. Must register with office prior to making appointment (M-F only). ?[facility] ?Henson, NP; Knapp, MD; Lalonde, MD; Tysinger, PA ?[address] [place], [place] [address] ?[phone] ?Mon-Fri 8:00-5:00 ?Babies seen by providers at [facility] ?Does NOT accept Medicaid/Commercial Insurance Only ?[facility] ([facility])  ?Artis, MD; Barnes, MD; Bratton, MD; Coccaro, MD; Lockett Gardner, MD; Kramer, MD; Marshall, MD; Netherton, MD; Poleto, MD; Skinner, MD ?[address] [place], [place] [address] ?[phone] ?Mon-Fri 8:30-5:30, Sat (Oct.-Mar.) 9:00-1:00 ?Babies seen by providers at [facility] ?Accepting Medicaid ? ?[place] ([address]) ?[facility] ?Reid, MD; Warner, MD ?[address], [place], [place] [address] ?[phone] ?Mon-Fri 8:30-5:00, Sat 8:30-12:00 ?Providers come to see babies at [facility] ?Does NOT accept Medicaid ?[facility] ?Becker, PA; Hagler, MD; Scifres, PA; Sun,  MD; Swayne, MD ?[address], [place], [place] [address] ?[phone] ?Mon-Fri 8:00-5:00 ?Babies seen by providers at [facility] ?Does NOT accept Medicaid ?Only accepting babies of parents who are patients ?Please call early in hospitalization for appointment (limited availability) ?[facility] ?Clark, MD; Frye, MD; Kelleher, MD; Mack, NP; Miller, MD; O'Keller, MD; Patterson, NP; Pudlo, MD; Puzio, MD; Thomas, MD; Tucker, MD; Twiselton, MD ?[address], [place], [place] [address] ?[phone] ?Mon-Fri 8:00-5:00, Sat 9:00-12:00 ?Providers come to see babies at [facility] ?Does NOT accept Medicaid ? ?[place] ([id number]) ?[facility] ?Limited providers accepting new patients: Brake, NP; Wharton, PA ?[address], [place], [place] [address] ?[phone] ?Mon-Fri 8:00-5:00 ?Babies seen by providers at [facility] ?Does NOT accept Medicaid ?Only accepting babies of parents who are patients ?Please call early in hospitalization for appointment (limited availability) ?[facility] ?Gay, MD; Quinlan, MD ?[address] [place],  [address] ?[phone] (press 1 to schedule appointment) ?Mon-Fri 8:00-5:00 ?Providers come to see babies at [facility] ?Does NOT accept Medicaid ?[facility] ?Mazer, MD ?[address] [place],  [address] ?[phone] ?Mon-Fri 8:30-5:00 (lunch 12:30-1:00), extended hours by appointment only Wed 5:00-6:30 ?Babies seen by [facility] providers ?Accepting Medicaid ?[facility] ?Banks, MD; Jordan, MD; Koberlein, MD ?[address], [place],  [address] ?[phone] ?Mon-Fri 8:00-5:00 ?Babies seen by [facility] providers ?Does NOT accept Medicaid ? [facility] ?Parker, MD; Hunter, MD; Wallace, DO ?[address]   [place], [place] [address] ?[phone] ?Mon-Fri 8:00-5:00 ?Babies seen by [facility] providers ?Does NOT accept Medicaid ?[facility] ?Brandon, PA; Brecken, PA; Christy, NP; Dees, MD; DeClaire, MD; DeWeese, MD; Hansen, NP; Mills, NP; Parrish, NP; Smoot, NP; Summer, MD; Vapne,

## 2021-08-30 NOTE — Progress Notes (Signed)
New OB Intake ? ?I connected with  Michelle Guzman on 08/30/21 at  9:15 AM EDT by MyChart Video Visit and verified that I am speaking with the correct person using two identifiers. Nurse is located at [facility] and pt is located at [employer] [facility]. ? ?I discussed the limitations, risks, security and privacy concerns of performing an evaluation and management service by telephone and the availability of in person appointments. I also discussed with the patient that there may be a patient responsible charge related to this service. The patient expressed understanding and agreed to proceed. ? ?I explained I am completing New OB Intake today. We discussed her EDD of 04/03/22 that is based on LMP of 06/27/21. Pt is G6/P1. I reviewed her allergies, medications, Medical/Surgical/OB history, and appropriate screenings. I informed her of [facility] services. Based on history, this is a/an  pregnancy uncomplicated .  ? ?Patient Active Problem List  ? Diagnosis Date Noted  ? Supervision of high risk pregnancy, antepartum 08/30/2021  ? AMA (advanced maternal age) multigravida 35+ 08/30/2021  ? Subchorionic hemorrhage in first trimester 08/30/2021  ? Retained products of conception with hemorrhage   ? Hemorrhage pregnancy, first trimester   ? ? ?Concerns addressed today ? ?Delivery Plans:  ?Plans to deliver at [facility]  ? ?MyChart/Babyscripts ?MyChart access verified. I explained pt will have some visits in office and some virtually. Babyscripts instructions given and order placed. Patient verifies receipt of registration text/e-mail. Account successfully created and app downloaded. ? ?Blood Pressure Cuff  ?Blood pressure cuff ordered for patient to pick-up from [employer]. Explained after first prenatal appt pt will check weekly and document in 74. ? ?Weight scale: Patient does / does not  have weight scale. Weight scale ordered for patient to pick up from [employer].  ? ?Anatomy US ?Explained first scheduled [place] will be  around 19 weeks. Anatomy US scheduled for 11/07/21 at 0945. Pt notified to arrive at 0930. ?Scheduled AFP lab only appointment if CenteringPregnancy pt for same day as anatomy US.  ? ?Labs ?Discussed Johnsie Cancel genetic screening with patient. Would like both Panorama and Horizon drawn at new OB visit.Also if interested in genetic testing, tell patient she will need AFP 15-21 weeks to complete genetic testing .Routine prenatal labs needed. ? ?Covid Vaccine ?Patient has not covid vaccine.  ? ?Is patient a CenteringPregnancy candidate? Accepted  "Centering Patient" indicated on sticky note ?  ?Is patient a Mom+Baby Combined Care candidate? Not a candidate   Scheduled with Mom+Baby provider  ?  ?Is patient interested in [place]? No  "Interested in [employer] - Schedule next visit with CNM" on sticky note ? ?Informed patient of [facility] website  and placed link in her AVS.  ? ?Social Determinants of Health ?Food Insecurity: Patient denies food insecurity. ?WIC Referral: Patient is interested in referral to [facility].  ?Transportation: Patient denies transportation needs. ?Childcare: Discussed no children allowed at ultrasound appointments. Offered childcare services; patient declines childcare services at this time. ? ?Send link to Pregnancy Navigators ? ? ?Placed OB Box on problem list and updated ? ?First visit review ?I reviewed new OB appt with pt. I explained she will have a pelvic exam, ob bloodwork with genetic screening, and PAP smear. Explained pt will be seen by Dr.Duncan at first visit; encounter routed to appropriate provider. Explained that patient will be seen by pregnancy navigator following visit with provider. [facility] information placed in AVS.  ? ?Bethanne Ginger, CMA ?08/30/2021  9:55 AM  ?

## 2021-09-10 NOTE — Progress Notes (Signed)
? ?History:  ? Michelle Guzman is a 36 y.o. [id number] at [redacted]w[redacted]d by LMP being seen today for her first obstetrical visit.   Patient does intend to breast feed.  ? ?Pregnancy history fully reviewed. Obstetrical history is significant for history of FT and PT birth (27w).  ? ?Patient reports no complaints. ?  ? ?  ?HISTORY: ?OB History  ?Gravida Para Term Preterm AB Living  ?6 2 1 1 3 2   ?SAB IAB Ectopic Multiple Live Births  ?2 1 0 0 2  ?  ?# Outcome Date GA Lbr Len/2nd Weight Sex Delivery Anes PTL Lv  ?6 Current           ?5 SAB 2021          ?4 Preterm 01/06/09 [redacted]w[redacted]d   F Vag-Spont   LIV  ?3 Term 01/18/07 [redacted]w[redacted]d   F Vag-Spont   LIV  ?2 SAB  [redacted]w[redacted]d         ?1 IAB           ?  ? ?Last pap smear :  ?No results found for: DIAGPAP, HPV, HPVHIGH ? ? ?Past Medical History:  ?Diagnosis Date  ? Medical history non-contributory   ? Vitamin K deficiency coagulation disorder (HCC)   ? ?Past Surgical History:  ?Procedure Laterality Date  ? DILATION AND CURETTAGE OF UTERUS N/A 09/24/2019  ? Procedure: SUCTION DILATATION AND CURETTAGE;  Surgeon: 09/26/2019, MD;  Location: [facility] OR;  Service: Gynecology;  Laterality: N/A;  ? NO PAST SURGERIES    ? ?Family History  ?Problem Relation Age of Onset  ? Lupus Mother   ? Drug abuse Mother   ? Hypertension Mother   ? Mental illness Mother   ? Drug abuse Father   ? Heart disease Father   ? ?Social History  ? ?Tobacco Use  ? Smoking status: Never  ? Smokeless tobacco: Never  ?Substance Use Topics  ? Alcohol use: Not Currently  ? Drug use: Not Currently  ?  Types: Marijuana  ?  Comment: stopped a couple weeks ago per pt on 09/23/19  ? ?Allergies  ?Allergen Reactions  ? Lactose Intolerance (Gi) Nausea And Vomiting and Other (See Comments)  ?  headaches  ? Orange Juice [Orange Oil] Swelling  ? ?Current Outpatient Medications on File Prior to Visit  ?Medication Sig Dispense Refill  ? Blood Pressure Monitoring DEVI 1 each by Does not apply route once a week. 1 each 0  ? Prenatal Vit-Fe  Fumarate-FA (PRENATAL PO) Take by mouth.    ? ?No current facility-administered medications on file prior to visit.  ? ? ?Review of Systems ?Pertinent items noted in HPI and remainder of comprehensive ROS otherwise negative. ? ?Physical Exam:  ? ?Vitals:  ? 09/13/21 0946  ?BP: 119/72  ?Pulse: 78  ?Weight: 118 lb (53.5 kg)  ? ?  ?Bedside Ultrasound for FHR check: Viable intrauterine pregnancy with positive cardiac activity noted, fetal heart rate 158 bpm ? ?Patient informed that the ultrasound is considered a limited obstetric ultrasound and is not intended to be a complete ultrasound exam.  Patient also informed that the ultrasound is not being completed with the intent of assessing for fetal or placental anomalies or any pelvic abnormalities.  Explained that the purpose of today?s ultrasound is to assess for fetal heart rate.  Patient acknowledges the purpose of the exam and the limitations of the study. ? ?General: well-developed, well-nourished female in no acute distress  ?Breasts:  normal  appearance, no masses or tenderness bilaterally  ?Skin: normal coloration and turgor, no rashes  ?Neurologic: oriented, normal, negative, normal mood  ?Extremities: normal strength, tone, and muscle mass, ROM of all joints is normal  ?HEENT PERRLA, extraocular movement intact and sclera clear, anicteric  ?Neck supple and no masses  ?Cardiovascular: regular rate and rhythm  ?Respiratory:  no respiratory distress, normal breath sounds  ?Abdomen: soft, non-tender; bowel sounds normal; no masses,  no organomegaly  ?Pelvic: normal external genitalia, no lesions, normal vaginal mucosa, normal vaginal discharge, normal cervix, pap smear done. Uterine size:  aga  ?  ?Assessment:  ?  ?Pregnancy: [id number] ?Patient Active Problem List  ? Diagnosis Date Noted  ? Supervision of high risk pregnancy, antepartum 08/30/2021  ? AMA (advanced maternal age) multigravida 35+ 08/30/2021  ? Subchorionic hemorrhage in first trimester 08/30/2021  ?  Retained products of conception with hemorrhage   ? Hemorrhage pregnancy, first trimester   ? ?  ?Plan:  ?  ?1. Supervision of high risk pregnancy, antepartum ?Initial labs drawn. Pap with HPV done.  ?Continue prenatal vitamins. ?Problem list reviewed and updated. ?Genetic Screening discussed, NIPS: ordered. ?Ultrasound discussed; fetal anatomic survey: scheduled for 11/07/21. ?Anticipatory guidance about prenatal visits given including labs, ultrasounds, and testing. ?Discussed usage of Babyscripts and virtual visits ? ?2. Multigravida of advanced maternal age in first trimester ?Anatomy on 6/7 - check CL at that time for h/o PTB (27w) ?She is also going to consider Cerclage for possible history of cervical competence. At 27w but reports PPROM then delivery. We discussed could do early cerclage vs based on CL. She will consider her options. She is not ready to decide today.  ? ?3. Retained products of conception with hemorrhage ?RPOC after 8w SAB ? ?4. Vitamin K def disorder ?- Refer to Hematology ? ?The nature of [place] [facility]ulty Practice with multiple MDs and Advanced Practice Providers was explained to patient; also emphasized that residents, students are part of our team. ?Routine obstetric precautions reviewed. Encouraged to seek out care at office or emergency room [facility] MAU preferred) for urgent and/or emergent concerns. ?Return in about 4 weeks (around 10/11/2021) for OB VISIT, MD or APP.  ?  ?Milas Hock, MD, FACOG ?Obstetrician Heritage manager, Faculty Practice ?[facility] for [employer], [facility] ? ?

## 2021-09-11 ENCOUNTER — Encounter (HOSPITAL_COMMUNITY): Payer: Self-pay | Admitting: Obstetrics & Gynecology

## 2021-09-11 ENCOUNTER — Inpatient Hospital Stay (HOSPITAL_COMMUNITY)
Admission: AD | Admit: 2021-09-11 | Discharge: 2021-09-11 | Disposition: A | Discharge status: Home / Self Care | Payer: Medicaid Other | Attending: Obstetrics & Gynecology | Admitting: Obstetrics & Gynecology

## 2021-09-11 ENCOUNTER — Other Ambulatory Visit: Payer: Self-pay

## 2021-09-11 DIAGNOSIS — O26891 Other specified pregnancy related conditions, first trimester: Secondary | ICD-10-CM

## 2021-09-11 DIAGNOSIS — Z3A10 10 weeks gestation of pregnancy: Secondary | ICD-10-CM

## 2021-09-11 DIAGNOSIS — O0990 Supervision of high risk pregnancy, unspecified, unspecified trimester: Secondary | ICD-10-CM

## 2021-09-11 DIAGNOSIS — O418X10 Other specified disorders of amniotic fluid and membranes, first trimester, not applicable or unspecified: Secondary | ICD-10-CM

## 2021-09-11 DIAGNOSIS — R1030 Lower abdominal pain, unspecified: Secondary | ICD-10-CM | POA: Diagnosis present

## 2021-09-11 DIAGNOSIS — O09521 Supervision of elderly multigravida, first trimester: Secondary | ICD-10-CM

## 2021-09-11 DIAGNOSIS — N898 Other specified noninflammatory disorders of vagina: Secondary | ICD-10-CM

## 2021-09-11 LAB — URINALYSIS, ROUTINE W REFLEX MICROSCOPIC
Bilirubin Urine: NEGATIVE
Glucose, UA: NEGATIVE mg/dL
Hgb urine dipstick: NEGATIVE
Ketones, ur: NEGATIVE mg/dL
Leukocytes,Ua: NEGATIVE
Nitrite: NEGATIVE
Protein, ur: NEGATIVE mg/dL
Specific Gravity, Urine: 1.02 (ref 1.005–1.030)
pH: 5 (ref 5.0–8.0)

## 2021-09-11 NOTE — Progress Notes (Signed)
Joni Reining, CSW in attendance at bedside.  ?

## 2021-09-11 NOTE — MAU Provider Note (Addendum)
?History  ?  ? ?CSN: [id number] ? ?Arrival date and time: 09/11/21 0943 ? ? Event Date/Time  ? First Provider Initiated Contact with Patient 09/11/21 1018   ?  ? ?Chief Complaint  ?Patient presents with  ? Abdominal Pain  ? Rupture of Membranes  ? ?36 year old G6 P1-1-3-2 at 10.6 weeks presenting with lower abdominal cramping and LOF.  Reports 2 gushes of milky watery fluid this morning.  Endorses constant lower abdominal cramping since yesterday.  Rates pain 5 out of 10.  Has not treated.  Denies urinary symptoms.  Denies recent intercourse.  Reports using Monistat cream vaginally for a yeast infection, last used yesterday morning.  Denies vaginal bleeding or spotting. ? ? ?OB History   ? ? Gravida  ?6  ? Para  ?2  ? Term  ?1  ? Preterm  ?1  ? AB  ?3  ? Living  ?2  ?  ? ? SAB  ?2  ? IAB  ?1  ? Ectopic  ?   ? Multiple  ?   ? Live Births  ?2  ?   ?  ?  ? ? ?Past Medical History:  ?Diagnosis Date  ? Medical history non-contributory   ? Vitamin K deficiency coagulation disorder ([place])   ? ? ?Past Surgical History:  ?Procedure Laterality Date  ? DILATION AND CURETTAGE OF UTERUS N/A 09/24/2019  ? Procedure: SUCTION DILATATION AND CURETTAGE;  Surgeon: Donnamae Jude, MD;  Location: [facility];  Service: Gynecology;  Laterality: N/A;  ? NO PAST SURGERIES    ? ? ?Family History  ?Problem Relation Age of Onset  ? Lupus Mother   ? Drug abuse Mother   ? Hypertension Mother   ? Mental illness Mother   ? Drug abuse Father   ? Heart disease Father   ? ? ?Social History  ? ?Tobacco Use  ? Smoking status: Never  ? Smokeless tobacco: Never  ?Substance Use Topics  ? Alcohol use: Not Currently  ? Drug use: Not Currently  ?  Types: Marijuana  ?  Comment: stopped a couple weeks ago per pt on 09/23/19  ? ? ?Allergies:  ?Allergies  ?Allergen Reactions  ? Lactose Intolerance (Gi) Nausea And Vomiting and Other (See Comments)  ?  headaches  ? Orange Juice [Orange Oil] Swelling  ? ? ?Medications Prior to Admission  ?Medication Sig Dispense Refill Last  Dose  ? Blood Pressure Monitoring DEVI 1 each by Does not apply route once a week. 1 each 0   ? Prenatal Vit-Fe Fumarate-FA (PRENATAL PO) Take by mouth.     ? ? ?Review of Systems  ?Gastrointestinal:  Positive for abdominal pain. Negative for constipation, diarrhea, nausea and vomiting.  ?Genitourinary:  Positive for vaginal discharge. Negative for vaginal bleeding.  ?Physical Exam  ? ?Blood pressure 107/62, pulse 79, temperature 98.8 ?F (37.1 ?C), temperature source Oral, resp. rate 18, last menstrual period 06/27/2021, SpO2 100 %, unknown if currently breastfeeding. ? ?Physical Exam ?Vitals and nursing note reviewed.  ?Constitutional:   ?   General: She is not in acute distress. ?   Appearance: Normal appearance.  ?HENT:  ?   Head: Normocephalic and atraumatic.  ?Cardiovascular:  ?   Rate and Rhythm: Normal rate.  ?Pulmonary:  ?   Effort: Pulmonary effort is normal. No respiratory distress.  ?Abdominal:  ?   Palpations: Abdomen is soft.  ?   Tenderness: There is no abdominal tenderness.  ?Musculoskeletal:     ?  General: Normal range of motion.  ?   Cervical back: Normal range of motion.  ?Skin: ?   General: Skin is warm and dry.  ?Neurological:  ?   General: No focal deficit present.  ?   Mental Status: She is alert and oriented to person, place, and time.  ?Psychiatric:     ?   Mood and Affect: Mood normal.     ?   Behavior: Behavior normal.  ? ?Limited bedside US: viable, active fetus, FHR 169 bpm, subj. nml AFV ? ?Results for orders placed or performed during the hospital encounter of 09/11/21 (from the past 24 hour(s))  ?Urinalysis, Routine w reflex microscopic Urine, Clean Catch     Status: Abnormal  ? Collection Time: 09/11/21 10:11 AM  ?Result Value Ref Range  ? Color, Urine YELLOW YELLOW  ? APPearance HAZY (A) CLEAR  ? Specific Gravity, Urine 1.020 1.005 - 1.030  ? pH 5.0 5.0 - 8.0  ? Glucose, UA NEGATIVE NEGATIVE mg/dL  ? Hgb urine dipstick NEGATIVE NEGATIVE  ? Bilirubin Urine NEGATIVE NEGATIVE  ?  Ketones, ur NEGATIVE NEGATIVE mg/dL  ? Protein, ur NEGATIVE NEGATIVE mg/dL  ? Nitrite NEGATIVE NEGATIVE  ? Leukocytes,Ua NEGATIVE NEGATIVE  ? ?MAU Course  ?Procedures ? ?MDM ?Labs ordered and reviewed.  Negative cultures last month therefore not recollected today.  Denies concern for STD.  No evidence of ruptured membranes.  Patient reassured.  Stable for discharge home. ? ?Assessment and Plan  ?[redacted] weeks gestation ?Vaginal discharge in pregnancy ?Discharge home ?Follow-up with [facility] as scheduled ?Return precautions ? ?Allergies as of 09/11/2021   ? ?   Reactions  ? Lactose Intolerance (gi) Nausea And Vomiting, Other (See Comments)  ? headaches  ? Orange Juice [orange Oil] Swelling  ? ?  ? ?  ?Medication List  ?  ? ?TAKE these medications   ? ?Blood Pressure Monitoring Devi ?1 each by Does not apply route once a week. ?  ?PRENATAL PO ?Take by mouth. ?  ? ?  ? ? ?Julianne Handler, CNM ?09/11/2021, 10:35 AM  ?

## 2021-09-11 NOTE — MAU Note (Signed)
Michelle Guzman is a 36 y.o. at [redacted]w[redacted]d here in MAU reporting: lower abdominal cramping and LOF that began @ 0800 this morning.  Reports fluid was milky white/clear.  Denies VB.  Reports took last dose of med for yeast infection yesterday morning. ?LMP:  ?Onset of complaint: today ?Pain score: 5 ?Vitals:  ? 09/11/21 0948  ?BP: 107/62  ?Pulse: 79  ?Resp: 18  ?Temp: 98.8 ?F (37.1 ?C)  ?SpO2: 100%  ?   ? ?Lab orders placed from triage:    ?

## 2021-09-13 ENCOUNTER — Encounter: Payer: Self-pay | Admitting: Obstetrics and Gynecology

## 2021-09-13 ENCOUNTER — Ambulatory Visit (INDEPENDENT_AMBULATORY_CARE_PROVIDER_SITE_OTHER): Payer: Medicaid Other | Admitting: Obstetrics and Gynecology

## 2021-09-13 ENCOUNTER — Other Ambulatory Visit (HOSPITAL_COMMUNITY)
Admission: RE | Admit: 2021-09-13 | Discharge: 2021-09-13 | Disposition: A | Discharge status: Home / Self Care | Payer: Medicaid Other | Source: Ambulatory Visit | Attending: Obstetrics and Gynecology | Admitting: Obstetrics and Gynecology

## 2021-09-13 ENCOUNTER — Telehealth: Payer: Self-pay | Admitting: Hematology and Oncology

## 2021-09-13 VITALS — BP 119/72 | HR 78 | Wt 118.0 lb

## 2021-09-13 DIAGNOSIS — O0990 Supervision of high risk pregnancy, unspecified, unspecified trimester: Secondary | ICD-10-CM | POA: Insufficient documentation

## 2021-09-13 DIAGNOSIS — O09521 Supervision of elderly multigravida, first trimester: Secondary | ICD-10-CM

## 2021-09-13 DIAGNOSIS — Z8751 Personal history of pre-term labor: Secondary | ICD-10-CM

## 2021-09-13 DIAGNOSIS — D684 Acquired coagulation factor deficiency: Secondary | ICD-10-CM | POA: Diagnosis not present

## 2021-09-13 NOTE — Telephone Encounter (Signed)
Scheduled appt per 4/13 referral. Pt is aware of appt date and time. Pt is aware to arrive 15 mins prior to appt time and to bring and updated insurance card. Pt is aware of appt location.   ?

## 2021-09-13 NOTE — Progress Notes (Signed)
Informal bedside ultrasound performed to assess FHR, FHR 158bpm ?

## 2021-09-14 ENCOUNTER — Telehealth: Payer: Self-pay

## 2021-09-14 LAB — CBC/D/PLT+RPR+RH+ABO+RUBIGG...
Antibody Screen: NEGATIVE
Basophils Absolute: 0 10*3/uL (ref 0.0–0.2)
Basos: 0 %
EOS (ABSOLUTE): 0.1 10*3/uL (ref 0.0–0.4)
Eos: 2 %
HCV Ab: NONREACTIVE
HIV Screen 4th Generation wRfx: NONREACTIVE
Hematocrit: 35.6 % (ref 34.0–46.6)
Hemoglobin: 11.8 g/dL (ref 11.1–15.9)
Hepatitis B Surface Ag: NEGATIVE
Immature Grans (Abs): 0 10*3/uL (ref 0.0–0.1)
Immature Granulocytes: 0 %
Lymphocytes Absolute: 1.8 10*3/uL (ref 0.7–3.1)
Lymphs: 30 %
MCH: 28.9 pg (ref 26.6–33.0)
MCHC: 33.1 g/dL (ref 31.5–35.7)
MCV: 87 fL (ref 79–97)
Monocytes Absolute: 0.8 10*3/uL (ref 0.1–0.9)
Monocytes: 12 %
Neutrophils Absolute: 3.4 10*3/uL (ref 1.4–7.0)
Neutrophils: 56 %
Platelets: 364 10*3/uL (ref 150–450)
RBC: 4.09 x10E6/uL (ref 3.77–5.28)
RDW: 12.4 % (ref 11.7–15.4)
RPR Ser Ql: NONREACTIVE
Rh Factor: POSITIVE
Rubella Antibodies, IGG: 3.25 index (ref >0.99)
WBC: 6.2 10*3/uL (ref 3.4–10.8)

## 2021-09-14 LAB — HEMOGLOBIN A1C
Est. average glucose Bld gHb Est-mCnc: 105 mg/dL
Hgb A1c MFr Bld: 5.3 % (ref 4.8–5.6)

## 2021-09-14 LAB — HCV INTERPRETATION

## 2021-09-14 NOTE — Telephone Encounter (Signed)
Called Pt to remind of [facility] appt, on 09/19/21 @ 9 am, Pt states will be there. ?

## 2021-09-15 LAB — URINE CULTURE, OB REFLEX

## 2021-09-15 LAB — CULTURE, OB URINE

## 2021-09-17 LAB — CYTOLOGY - PAP
Chlamydia: NEGATIVE
Comment: NEGATIVE
Comment: NEGATIVE
Comment: NORMAL
Diagnosis: NEGATIVE
High risk HPV: NEGATIVE
Neisseria Gonorrhea: NEGATIVE

## 2021-09-19 ENCOUNTER — Ambulatory Visit (INDEPENDENT_AMBULATORY_CARE_PROVIDER_SITE_OTHER): Payer: Medicaid Other | Admitting: Certified Nurse Midwife

## 2021-09-19 VITALS — BP 115/67 | HR 79 | Wt 118.4 lb

## 2021-09-19 DIAGNOSIS — O418X10 Other specified disorders of amniotic fluid and membranes, first trimester, not applicable or unspecified: Secondary | ICD-10-CM

## 2021-09-19 DIAGNOSIS — E739 Lactose intolerance, unspecified: Secondary | ICD-10-CM | POA: Insufficient documentation

## 2021-09-19 DIAGNOSIS — Z8751 Personal history of pre-term labor: Secondary | ICD-10-CM

## 2021-09-19 DIAGNOSIS — O468X1 Other antepartum hemorrhage, first trimester: Secondary | ICD-10-CM

## 2021-09-19 DIAGNOSIS — D684 Acquired coagulation factor deficiency: Secondary | ICD-10-CM | POA: Insufficient documentation

## 2021-09-19 DIAGNOSIS — O0991 Supervision of high risk pregnancy, unspecified, first trimester: Secondary | ICD-10-CM

## 2021-09-19 DIAGNOSIS — Z3A12 12 weeks gestation of pregnancy: Secondary | ICD-10-CM

## 2021-09-19 NOTE — BH Specialist Note (Signed)
Integrated Behavioral Health via Telemedicine Visit ? ?10/02/2021 ?[address] [place] ?[id number] ? ?Number of Integrated Behavioral Health Clinician visits: 1- Initial Visit ? ?Session Start time: 1314 ?  ?Session End time: 1405 ? ?Total time in minutes: 51 ? ? ?Referring Provider: Milas Hock, MD ?Patient/Family location: Home ?[facility] Provider location: [facility] [employer] at [facility] ? ?All persons participating in visit: Patient Michelle Guzman and [facility] Carollyn Etcheverry  ? ?Types of Service: Individual psychotherapy and Video visit ? ?I connected with Luna Kitchens and/or Joslyn Devon Armstrong Hillsman's  n/a  via  Telephone or [employer]  (Video is Surveyor, mining) and verified that I am speaking with the correct person using two identifiers. Discussed confidentiality: Yes  ? ?I discussed the limitations of telemedicine and the availability of in person appointments.  Discussed there is a possibility of technology failure and discussed alternative modes of communication if that failure occurs. ? ?I discussed that engaging in this telemedicine visit, they consent to the provision of behavioral healthcare and the services will be billed under their insurance. ? ?Patient and/or legal guardian expressed understanding and consented to Telemedicine visit: Yes  ? ?Presenting Concerns: ?Patient and/or family reports the following symptoms/concerns: Increased anxiety, worry, depression, fatigue, attributed to current life stress; staying active, journalling and managing work/life balance helps to cope with emotions. ?Duration of problem: Increase current pregnancy; Severity of problem: moderate ? ?Patient and/or Family's Strengths/Protective Factors: ?Social connections, Concrete supports in place (healthy food, safe environments, etc.), Sense of purpose, and Physical Health (exercise, healthy diet, medication  compliance, etc.) ? ?Goals Addressed: ?Patient will: ? Reduce symptoms of: anxiety, depression, and stress   ? Demonstrate ability to: Increase healthy adjustment to current life circumstances ? ?Progress towards Goals: ?Ongoing ? ?Interventions: ?Interventions utilized:  Psychoeducation and/or Health Education, Link to [employer], and Supportive Reflection ?Standardized Assessments completed: Not Needed ? ?Patient and/or Family Response: Patient agrees with treatment plan. ? ? ?Assessment: ?Patient currently experiencing Adjustment disorder with mixed anxious and depressed mood.  ? ?Patient may benefit from psychoeducation and brief therapeutic interventions regarding coping with symptoms of depression, anxiety ?. ? ?Plan: ?Follow up with behavioral health clinician on : Two weeks ?Behavioral recommendations:  ?-Continue taking prenatal vitamins as prescribed ?-Continue using self-coping strategies (journal, positive outlook, healthy sleeping and eating, etc.) to help manage emotions ?-Consider additional resources on After Visit Summary; use as needed ?Referral(s): Integrated Art gallery manager (In Clinic) and [employer]:  [employer] ? ?I discussed the assessment and treatment plan with the patient and/or parent/guardian. They were provided an opportunity to ask questions and all were answered. They agreed with the plan and demonstrated an understanding of the instructions. ?  ?They were advised to call back or seek an in-person evaluation if the symptoms worsen or if the condition fails to improve as anticipated. ? ?Rae Lips, LCSW ? ? ?  09/19/2021  ?  1:45 PM 09/13/2021  ?  9:52 AM 08/30/2021  ?  9:32 AM  ?Depression screen PHQ 2/9  ?Decreased Interest 1 2 0  ?Down, Depressed, Hopeless 2 2 0  ?PHQ - 2 Score 3 4 0  ?Altered sleeping 2 2 0  ?Tired, decreased energy 3 2 0  ?Change in appetite 0 0 0  ?Feeling bad or failure about yourself  1 1 0  ?Trouble concentrating 1 0  0  ?Moving slowly or fidgety/restless 1 0 0  ?Suicidal thoughts  0 0 0  ?PHQ-9 Score 11 9 0  ? ? ?  09/13/2021  ?  9:52 AM 08/30/2021  ?  9:33 AM  ?GAD 7 : Generalized Anxiety Score  ?Nervous, Anxious, on Edge 1 1  ?Control/stop worrying 1 1  ?Worry too much - different things 1 1  ?Trouble relaxing 2 0  ?Restless 1 0  ?Easily annoyed or irritable 2 0  ?Afraid - awful might happen 1 1  ?Total GAD 7 Score 9 4  ? ? ? ?

## 2021-09-19 NOTE — Patient Instructions (Signed)

## 2021-09-19 NOTE — Progress Notes (Addendum)
? ?  PRENATAL VISIT NOTE ? ?Subjective:  ?Michelle Guzman is a 36 y.o. [id number] at [redacted]w[redacted]d being seen today for ongoing prenatal care.  She is currently monitored for the following issues for this high-risk pregnancy and has Supervision of high risk pregnancy, antepartum; AMA (advanced maternal age) multigravida 6+; Subchorionic hemorrhage in first trimester; History of preterm delivery; Dietary lactose intolerance; and Vitamin K deficiency coagulation disorder ([place]) on their problem list. ? ?Patient reports no complaints.  Contractions: Not present.  .  Movement: Absent. Denies leaking of fluid.  ? ?The following portions of the patient's history were reviewed and updated as appropriate: allergies, current medications, past family history, past medical history, past social history, past surgical history and problem list.  ? ?Objective:  ? ?Vitals:  ? 09/19/21 0946  ?BP: 115/67  ?Pulse: 79  ?Weight: 118 lb 6.4 oz (53.7 kg)  ? ? ?Fetal Status: Fetal Heart Rate (bpm): 155   Movement: Absent    ? ?General:  Alert, oriented and cooperative. Patient is in no acute distress.  ?Skin: Skin is warm and dry. No rash noted.   ?Cardiovascular: Normal heart rate noted  ?Respiratory: Normal respiratory effort, no problems with respiration noted  ?Abdomen: Soft, gravid, appropriate for gestational age.  Pain/Pressure: Absent     ?Pelvic: Cervical exam deferred        ?Extremities: Normal range of motion.     ?Mental Status: Normal mood and affect. Normal behavior. Normal judgment and thought content.  ? ?Assessment and Plan:  ?Pregnancy: [id number] at [redacted]w[redacted]d ?1. Supervision of high risk pregnancy in first trimester ?- Doing well overall, no fetal movement yet ? ?2. [redacted] weeks gestation of pregnancy ?- Routine OB care  ? ?3. History of preterm delivery ?- Still considering cerclage, only has cramping after sex but not bleeding ? ?4. Subchorionic hemorrhage of placenta in first trimester, single or unspecified fetus ?- No  bleeding or cramping ? ?5. Vitamin K deficiency coagulation disorder ([place]) ?- Has been scheduled with Hematology/Oncology ? ?Facilitated discussion today:  reviewed confidentiality, photo release, timing of visits, common discomforts, when to call practice. Closed with [facility] 3 Breaths. ?  ?Reviewed preterm labor symptoms and general obstetric precautions including but not limited to vaginal bleeding, contractions, leaking of fluid and fetal movement were reviewed in detail with the patient. ? ?Please refer to After Visit Summary for other counseling recommendations.  ? ?Patient to continue group care. ? ?Future Appointments  ?Date Time Provider [place]  ?10/01/2021  9:00 AM Orson Slick, MD [facility] None  ?10/01/2021  9:45 AM [facility] LAB [facility] None  ?10/02/2021  1:15 PM [place] [facility] [place]  ?10/17/2021  9:00 AM CENTERING PROVIDER [facility] [place]  ?10/18/2021  9:30 AM [facility] NURSE [facility]  ?10/18/2021  9:45 AM [facility] US5 [facility]  ?11/07/2021  9:30 AM [facility] NURSE [facility]  ?11/07/2021  9:45 AM [facility] US4 [facility]  ?11/14/2021  9:00 AM CENTERING PROVIDER [facility] [place]  ?12/12/2021  9:00 AM CENTERING PROVIDER [facility] [place]  ?12/26/2021  9:00 AM CENTERING PROVIDER [facility] [place]  ?01/09/2022  9:00 AM CENTERING PROVIDER [facility] [place]  ?01/23/2022  9:00 AM CENTERING PROVIDER [facility] [place]  ?02/06/2022  9:00 AM CENTERING PROVIDER [facility] [place]  ?02/20/2022  9:00 AM CENTERING PROVIDER [facility]  ?03/06/2022  9:00 AM CENTERING PROVIDER [facility] [place]  ? ? ?Gabriel Carina, CNM ?

## 2021-09-19 NOTE — Progress Notes (Signed)
Patient has elevated PHQ9/GAD7, already has appointment set up with [facility].  ?

## 2021-10-01 ENCOUNTER — Inpatient Hospital Stay: Payer: Medicaid Other | Attending: Hematology and Oncology | Admitting: Hematology and Oncology

## 2021-10-01 ENCOUNTER — Inpatient Hospital Stay: Payer: Medicaid Other

## 2021-10-01 ENCOUNTER — Other Ambulatory Visit: Payer: Self-pay

## 2021-10-01 VITALS — BP 117/72 | HR 79 | Temp 97.9°F | Resp 16 | Wt 122.3 lb

## 2021-10-01 DIAGNOSIS — D500 Iron deficiency anemia secondary to blood loss (chronic): Secondary | ICD-10-CM

## 2021-10-01 DIAGNOSIS — O99019 Anemia complicating pregnancy, unspecified trimester: Secondary | ICD-10-CM | POA: Diagnosis not present

## 2021-10-01 DIAGNOSIS — E561 Deficiency of vitamin K: Secondary | ICD-10-CM | POA: Diagnosis not present

## 2021-10-01 DIAGNOSIS — D509 Iron deficiency anemia, unspecified: Secondary | ICD-10-CM | POA: Diagnosis present

## 2021-10-01 DIAGNOSIS — Z800 Family history of malignant neoplasm of digestive organs: Secondary | ICD-10-CM | POA: Insufficient documentation

## 2021-10-01 DIAGNOSIS — D684 Acquired coagulation factor deficiency: Secondary | ICD-10-CM

## 2021-10-01 LAB — CBC WITH DIFFERENTIAL (CANCER CENTER ONLY)
Abs Immature Granulocytes: 0.04 10*3/uL (ref 0.00–0.07)
Basophils Absolute: 0 10*3/uL (ref 0.0–0.1)
Basophils Relative: 0 %
Eosinophils Absolute: 0.1 10*3/uL (ref 0.0–0.5)
Eosinophils Relative: 2 %
HCT: 30.5 % — ABNORMAL LOW (ref 36.0–46.0)
Hemoglobin: 10.2 g/dL — ABNORMAL LOW (ref 12.0–15.0)
Immature Granulocytes: 1 %
Lymphocytes Relative: 31 %
Lymphs Abs: 2.2 10*3/uL (ref 0.7–4.0)
MCH: 29.5 pg (ref 26.0–34.0)
MCHC: 33.4 g/dL (ref 30.0–36.0)
MCV: 88.2 fL (ref 80.0–100.0)
Monocytes Absolute: 0.9 10*3/uL (ref 0.1–1.0)
Monocytes Relative: 12 %
Neutro Abs: 4 10*3/uL (ref 1.7–7.7)
Neutrophils Relative %: 54 %
Platelet Count: 281 10*3/uL (ref 150–400)
RBC: 3.46 MIL/uL — ABNORMAL LOW (ref 3.87–5.11)
RDW: 13.3 % (ref 11.5–15.5)
WBC Count: 7.2 10*3/uL (ref 4.0–10.5)
nRBC: 0 % (ref 0.0–0.2)

## 2021-10-01 LAB — CMP (CANCER CENTER ONLY)
ALT: 10 U/L (ref 0–44)
AST: 10 U/L — ABNORMAL LOW (ref 15–41)
Albumin: 3.7 g/dL (ref 3.5–5.0)
Alkaline Phosphatase: 46 U/L (ref 38–126)
Anion gap: 3 — ABNORMAL LOW (ref 5–15)
BUN: 5 mg/dL — ABNORMAL LOW (ref 6–20)
CO2: 26 mmol/L (ref 22–32)
Calcium: 8.9 mg/dL (ref 8.9–10.3)
Chloride: 105 mmol/L (ref 98–111)
Creatinine: 0.46 mg/dL (ref 0.44–1.00)
GFR, Estimated: >60 mL/min (ref >60)
Glucose, Bld: 80 mg/dL (ref 70–99)
Potassium: 3.9 mmol/L (ref 3.5–5.1)
Sodium: 134 mmol/L — ABNORMAL LOW (ref 135–145)
Total Bilirubin: 0.3 mg/dL (ref 0.3–1.2)
Total Protein: 6.4 g/dL — ABNORMAL LOW (ref 6.5–8.1)

## 2021-10-01 LAB — PROTIME-INR
INR: 1 (ref 0.8–1.2)
Prothrombin Time: 13.2 seconds (ref 11.4–15.2)

## 2021-10-01 LAB — RETIC PANEL
Immature Retic Fract: 19.1 % — ABNORMAL HIGH (ref 2.3–15.9)
RBC.: 3.48 MIL/uL — ABNORMAL LOW (ref 3.87–5.11)
Retic Count, Absolute: 74.8 10*3/uL (ref 19.0–186.0)
Retic Ct Pct: 2.2 % (ref 0.4–3.1)
Reticulocyte Hemoglobin: 32 pg (ref >27.9)

## 2021-10-01 LAB — FERRITIN: Ferritin: 16 ng/mL (ref 11–307)

## 2021-10-01 LAB — IRON AND IRON BINDING CAPACITY (CC-WL,HP ONLY)
Iron: 90 ug/dL (ref 28–170)
Saturation Ratios: 24 % (ref 10.4–31.8)
TIBC: 381 ug/dL (ref 250–450)
UIBC: 291 ug/dL (ref 148–442)

## 2021-10-01 LAB — APTT: aPTT: 29 seconds (ref 24–36)

## 2021-10-01 NOTE — Progress Notes (Signed)
[facility] [phone]   [phone]  INITIAL CONSULT NOTE  Patient Care Team: Patient, No Pcp Per (Inactive) as PCP - General (General Practice)  Hematological/Oncological History # Concern for Coagulopathy # Iron Deficiency Anemia in Pregnancy 10/01/2021: establish care with Dr. Leonides Schanz   CHIEF COMPLAINTS/PURPOSE OF CONSULTATION:  "Vitamin K Deficiency  "  HISTORY OF PRESENTING ILLNESS:  Michelle Guzman 36 y.o. female no signficant medical history who presents for evaluation of possible Vitamin K deficiency and iron deficiency anemia.   On exam today Michelle Guzman reports that she has struggled with vitamin K deficiency in the past.  She reports that it "shows up during labor".  She notes that her last pregnancy was unfortunately a miscarriage.  She notes this with her first pregnancy she required a blood transfusion after the birth due to heavy bleeding.  She has not recently been on any antibiotic therapy.  She notes that she has had trouble with iron before in the past and she has been on iron pills before.  She notes that she eats a regular diet and does not have any current dietary restrictions.  She otherwise denies any overt signs of bleeding at this time.  On further discussion her family history is remarkable for a pacemaker in her father.  Her maternal grandmother had stomach cancer and she stroke in her great-grandmother.  She reports that she is a never tobacco smoker and does not use any e-cigarettes or vapes.  She reports that she did smoke marijuana before in the past.  She only drinks occasional wine but has done some none since being pregnant.  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  She notes that she is not currently having any other symptoms.  She is not having lightheadedness, dizziness, or other major changes in her health.  She currently denies any fevers, chills,  sweats, nausea, vomiting or diarrhea.  A full 10 point ROS is listed below.  MEDICAL HISTORY:  Past Medical History:  Diagnosis Date   Medical history non-contributory    Vitamin K deficiency coagulation disorder (HCC)     SURGICAL HISTORY: Past Surgical History:  Procedure Laterality Date   DILATION AND CURETTAGE OF UTERUS N/A 09/24/2019   Procedure: SUCTION DILATATION AND CURETTAGE;  Surgeon: Reva Bores, MD;  Location: [facility] OR;  Service: Gynecology;  Laterality: N/A;   NO PAST SURGERIES      SOCIAL HISTORY: Social History   Socioeconomic History   Marital status: Single    Spouse name: Not on file   Number of children: Not on file   Years of education: Not on file   Highest education level: Not on file  Occupational History   Not on file  Tobacco Use   Smoking status: Never   Smokeless tobacco: Never  Substance and Sexual Activity   Alcohol use: Not Currently   Drug use: Not Currently    Types: Marijuana    Comment: stopped a couple weeks ago per pt on 09/23/19   Sexual activity: Yes    Birth control/protection: None  Other Topics Concern   Not on file  Social History Narrative   Not on file   Social Determinants of Health   Financial Resource Strain: Not on file  Food Insecurity: No Food Insecurity   Worried About Running Out of Food in the Last Year: Never true   Ran Out of Food in the Last Year: Never true  Transportation Needs: Personal assistant (Medical): Yes   Lack of Transportation (Non-Medical): Yes  Physical Activity: Not on file  Stress: Not on file  Social Connections: Not on file  Intimate Partner Violence: Not on file    FAMILY HISTORY: Family History  Problem Relation Age of Onset   Lupus Mother    Drug abuse Mother    Hypertension Mother    Mental illness Mother    Drug abuse Father    Heart disease Father     ALLERGIES:  is allergic to lactose intolerance (gi) and orange juice [orange  oil].  MEDICATIONS:  Current Outpatient Medications  Medication Sig Dispense Refill   Blood Pressure Monitoring DEVI 1 each by Does not apply route once a week. (Patient not taking: Reported on 09/19/2021) 1 each 0   Prenatal Vit-Fe Fumarate-FA (PRENATAL PO) Take by mouth.     No current facility-administered medications for this visit.    REVIEW OF SYSTEMS:   Constitutional: ( - ) fevers, ( - )  chills , ( - ) night sweats Eyes: ( - ) blurriness of vision, ( - ) double vision, ( - ) watery eyes Ears, nose, mouth, throat, and face: ( - ) mucositis, ( - ) sore throat Respiratory: ( - ) cough, ( - ) dyspnea, ( - ) wheezes Cardiovascular: ( - ) palpitation, ( - ) chest discomfort, ( - ) lower extremity swelling Gastrointestinal:  ( - ) nausea, ( - ) heartburn, ( - ) change in bowel habits Skin: ( - ) abnormal skin rashes Lymphatics: ( - ) new lymphadenopathy, ( - ) easy bruising Neurological: ( - ) numbness, ( - ) tingling, ( - ) new weaknesses Behavioral/Psych: ( - ) mood change, ( - ) new changes  All other systems were reviewed with the patient and are negative.  PHYSICAL EXAMINATION:  Vitals:   10/01/21 0901  BP: 117/72  Pulse: 79  Resp: 16  Temp: 97.9 F (36.6 C)  SpO2: 100%   Filed Weights   10/01/21 0901  Weight: 122 lb 4.8 oz (55.5 kg)    GENERAL: well appearing middle-aged African-American female in NAD  SKIN: skin color, texture, turgor are normal, no rashes or significant lesions EYES: conjunctiva are pink and non-injected, sclera clear LUNGS: clear to auscultation and percussion with normal breathing effort HEART: regular rate & rhythm and no murmurs and no lower extremity edema Musculoskeletal: no cyanosis of digits and no clubbing  PSYCH: alert & oriented x 3, fluent speech NEURO: no focal motor/sensory deficits  LABORATORY DATA:  I have reviewed the data as listed    Latest Ref Rng & Units 10/01/2021    9:43 AM 09/13/2021   10:59 AM 08/19/2021   12:29 PM   CBC  WBC 4.0 - 10.5 K/uL 7.2   6.2   6.4    Hemoglobin 12.0 - 15.0 g/dL 16.1   09.6   04.5    Hematocrit 36.0 - 46.0 % 30.5   35.6   33.9    Platelets 150 - 400 K/uL 281   364   357         Latest Ref Rng & Units 10/01/2021    9:43 AM 06/09/2020   12:22 AM 09/24/2019    9:46 AM  CMP  Glucose 70 - 99 mg/dL 80   95   91    BUN 6 - 20 mg/dL 5   10   5  Creatinine 0.44 - 1.00 mg/dL 5.62   1.30   8.65    Sodium 135 - 145 mmol/L 134   140   141    Potassium 3.5 - 5.1 mmol/L 3.9   3.6   4.8    Chloride 98 - 111 mmol/L 105   106   108    CO2 22 - 32 mmol/L 26   25     Calcium 8.9 - 10.3 mg/dL 8.9   8.7     Total Protein 6.5 - 8.1 g/dL 6.4   6.5     Total Bilirubin 0.3 - 1.2 mg/dL 0.3   0.8     Alkaline Phos 38 - 126 U/L 46   35     AST 15 - 41 U/L 10   12     ALT 0 - 44 U/L 10   11        ASSESSMENT & PLAN Michelle Guzman 36 y.o. female no signficant medical history who presents for evaluation of possible Vitamin K deficiency and iron deficiency anemia.   After review of the labs, review of the records, and discussion with the patient the patients findings are most consistent with iron deficiency anemia of pregnancy.  At this time there is no clear evidence that the patient has a coagulopathy or vitamin K deficiency.  She has a normal INR, PT, and PTT.  Administration of vitamin K therapy could be considered at around the time of delivery if there was concerned about coagulopathy.  Consideration could be put into vitamin K 2.5 mg p.o. when the patient goes into labor in order to assure no issues with deficiency, though there is no clear evidence of a vitamin K deficiency this time.  Additionally vitamin K deficiency is typically only seen in infants and individuals who have undergone prolonged antibiotic therapy.  #Concern for vitamin K deficiency --No clear evidence for vitamin K deficiency on today's labs including PT, INR, and PTT --At this time there is no firm reason  to give vitamin K around the time of delivery, though one could consider administering vitamin K 2.5 mg p.o. within 24 hours prior to delivery if there was concern for coagulopathy --Oncology will continue to follow with the patient due to her iron deficiency  #Iron deficiency anemia secondary to GYN bleeding --Patient has findings concerning for a iron deficiency anemia of pregnancy. --Due to pronounced GI symptoms with p.o. iron therapy would recommend IV iron therapy to help bolster her levels --We will schedule the patient with a hospital for IV iron administration  Orders Placed This Encounter  Procedures   CBC with Differential (Cancer Center Only)    Standing Status:   Future    Number of Occurrences:   1    Standing Expiration Date:   10/02/2022   CMP (Cancer Center only)    Standing Status:   Future    Number of Occurrences:   1    Standing Expiration Date:   10/02/2022   Ferritin    Standing Status:   Future    Number of Occurrences:   1    Standing Expiration Date:   10/02/2022   Iron and Iron Binding Capacity ([facility] only)    Standing Status:   Future    Number of Occurrences:   1    Standing Expiration Date:   10/02/2022   Retic Panel    Standing Status:   Future    Number of Occurrences:   1  Standing Expiration Date:   10/02/2022   APTT    Standing Status:   Future    Number of Occurrences:   1    Standing Expiration Date:   10/01/2022   Protime-INR    Standing Status:   Future    Number of Occurrences:   1    Standing Expiration Date:   10/02/2022    All questions were answered. The patient knows to call the clinic with any problems, questions or concerns.  A total of more than 60 minutes were spent on this encounter with face-to-face time and non-face-to-face time, including preparing to see the patient, ordering tests and/or medications, counseling the patient and coordination of care as outlined above.   Ulysees Barns, MD Department of  Hematology/Oncology [facility] Phone: [phone] Pager: [phone] Email: [email or web address]  10/14/2021 6:18 PM

## 2021-10-02 ENCOUNTER — Ambulatory Visit (INDEPENDENT_AMBULATORY_CARE_PROVIDER_SITE_OTHER): Payer: Medicaid Other | Admitting: Clinical

## 2021-10-02 DIAGNOSIS — F4323 Adjustment disorder with mixed anxiety and depressed mood: Secondary | ICD-10-CM

## 2021-10-02 NOTE — Patient Instructions (Signed)
[facility] [address] , [place] [address] [phone] (main office) [phone] (Sonnie Bias's office)  [email or web address]  [email or web address]      

## 2021-10-10 NOTE — BH Specialist Note (Signed)
Integrated Behavioral Health via Telemedicine Visit  10/23/2021 Nohealani Medinger [id number]  Number of Integrated Behavioral Health Clinician visits: 2- Second Visit  Session Start time: 1319   Session End time: 1349  Total time in minutes: 30   Referring Provider: Milas Hock, MD Patient/Family location: Home [facility] Provider location: [facility] for [facility]  All persons participating in visit: Patient Michelle Guzman and [facility] Anton Cheramie   Types of Service: Individual psychotherapy and Video visit  I connected with Luna Kitchens and/or Joslyn Devon Armstrong Hillsman's  n/a  via  Telephone or [employer] Telemedicine Application  (Video is Caregility application) and verified that I am speaking with the correct person using two identifiers. Discussed confidentiality: Yes   I discussed the limitations of telemedicine and the availability of in person appointments.  Discussed there is a possibility of technology failure and discussed alternative modes of communication if that failure occurs.  I discussed that engaging in this telemedicine visit, they consent to the provision of behavioral healthcare and the services will be billed under their insurance.  Patient and/or legal guardian expressed understanding and consented to Telemedicine visit: Yes   Presenting Concerns: Patient and/or family reports the following symptoms/concerns: Concern over recent health scare ("passing out" with unknown cause); pt states she's a "holder-inner" (internalizing stress); open to implementing self-coping strategy for awareness and stress management. Duration of problem: Increase current pregnancy; Severity of problem: moderate  Patient and/or Family's Strengths/Protective Factors: Social connections, Concrete supports in place (healthy food, safe environments, etc.), Sense of purpose, and Physical Health  (exercise, healthy diet, medication compliance, etc.)  Goals Addressed: Patient will:  Reduce symptoms of: anxiety, depression, and stress   Increase knowledge and/or ability of: coping skills   Demonstrate ability to: Increase healthy adjustment to current life circumstances  Progress towards Goals: Ongoing  Interventions: Interventions utilized:  Mindfulness or Relaxation Training Standardized Assessments completed:  PHQ9/GAD7 given in past two weeks  Patient and/or Family Response: Patient agrees with treatment plan.   Assessment: Patient currently experiencing Adjustment disorder with mixed anxious and depressed mood  Patient may benefit from continued therapeutic interventions.  Plan: Follow up with behavioral health clinician on : Two weeks Behavioral recommendations:  -Continue taking prenatal vitamins as prescribed -Continue using self-coping strategies daily (journal, positive outlook, healthy self-care) -CALM relaxation breathing exercise twice daily (morning; at bedtime with sleep sounds); as needed throughout the day for the next two weeks  Referral(s): Integrated [employer] (In Clinic)  I discussed the assessment and treatment plan with the patient and/or parent/guardian. They were provided an opportunity to ask questions and all were answered. They agreed with the plan and demonstrated an understanding of the instructions.   They were advised to call back or seek an in-person evaluation if the symptoms worsen or if the condition fails to improve as anticipated.  Valetta Close Tywana Robotham, LCSW     10/17/2021   12:00 PM 09/19/2021    1:45 PM 09/13/2021    9:52 AM 08/30/2021    9:32 AM  Depression screen PHQ 2/9  Decreased Interest 1 1 2  0  Down, Depressed, Hopeless 0 2 2 0  PHQ - 2 Score 1 3 4  0  Altered sleeping 1 2 2  0  Tired, decreased energy 1 3 2  0  Change in appetite 0 0 0 0  Feeling bad or failure about yourself  1 1 1  0  Trouble  concentrating 1 1  0 0  Moving slowly or fidgety/restless 0 1 0 0  Suicidal thoughts 0 0 0 0  PHQ-9 Score 5 11 9  0      10/17/2021   12:01 PM 09/13/2021    9:52 AM 08/30/2021    9:33 AM  GAD 7 : Generalized Anxiety Score  Nervous, Anxious, on Edge 1 1 1   Control/stop worrying 1 1 1   Worry too much - different things 1 1 1   Trouble relaxing 1 2 0  Restless 2 1 0  Easily annoyed or irritable 1 2 0  Afraid - awful might happen 0 1 1  Total GAD 7 Score 7 9 4

## 2021-10-15 ENCOUNTER — Telehealth: Payer: Self-pay | Admitting: Hematology and Oncology

## 2021-10-15 ENCOUNTER — Encounter: Payer: Self-pay | Admitting: Obstetrics and Gynecology

## 2021-10-15 NOTE — Telephone Encounter (Signed)
Per 5/14 inbasket called and spoke to pt about appointe.  Pt confirmed appointment  ?

## 2021-10-17 ENCOUNTER — Ambulatory Visit (INDEPENDENT_AMBULATORY_CARE_PROVIDER_SITE_OTHER): Payer: Medicaid Other | Admitting: Certified Nurse Midwife

## 2021-10-17 ENCOUNTER — Other Ambulatory Visit: Payer: Self-pay | Admitting: Obstetrics and Gynecology

## 2021-10-17 VITALS — BP 117/72 | HR 75 | Wt 124.4 lb

## 2021-10-17 DIAGNOSIS — IMO0001 Reserved for inherently not codable concepts without codable children: Secondary | ICD-10-CM

## 2021-10-17 DIAGNOSIS — O468X1 Other antepartum hemorrhage, first trimester: Secondary | ICD-10-CM

## 2021-10-17 DIAGNOSIS — O09522 Supervision of elderly multigravida, second trimester: Secondary | ICD-10-CM

## 2021-10-17 DIAGNOSIS — O0990 Supervision of high risk pregnancy, unspecified, unspecified trimester: Secondary | ICD-10-CM

## 2021-10-17 DIAGNOSIS — Z3A15 15 weeks gestation of pregnancy: Secondary | ICD-10-CM

## 2021-10-17 DIAGNOSIS — O418X10 Other specified disorders of amniotic fluid and membranes, first trimester, not applicable or unspecified: Secondary | ICD-10-CM

## 2021-10-17 DIAGNOSIS — Z3A16 16 weeks gestation of pregnancy: Secondary | ICD-10-CM

## 2021-10-17 DIAGNOSIS — R55 Syncope and collapse: Secondary | ICD-10-CM

## 2021-10-17 NOTE — Progress Notes (Signed)
? ?PRENATAL VISIT NOTE ? ?Subjective:  ?Michelle Guzman is a 36 y.o. [id number] at [redacted]w[redacted]d being seen today for ongoing prenatal care.  She is currently monitored for the following issues for this high-risk pregnancy and has Supervision of high risk pregnancy, antepartum; AMA (advanced maternal age) multigravida 35+; Subchorionic hemorrhage in first trimester; History of preterm delivery; Dietary lactose intolerance; and Vitamin K deficiency coagulation disorder (HCC) on their problem list. ? ?Patient reports no complaints, but did have a syncopal episode the day before yesterday. Had eaten recently, was not overheated or dehydrated.  Contractions: Not present.  .  Movement: Present. Denies leaking of fluid.  ? ?The following portions of the patient's history were reviewed and updated as appropriate: allergies, current medications, past family history, past medical history, past social history, past surgical history and problem list.  ? ?Objective:  ? ?Vitals:  ? 10/17/21 0933  ?BP: 117/72  ?Pulse: 75  ?Weight: 124 lb 6.4 oz (56.4 kg)  ? ? ?Fetal Status: Fetal Heart Rate (bpm): 155   Movement: Present    ? ?General:  Alert, oriented and cooperative. Patient is in no acute distress.  ?Skin: Skin is warm and dry. No rash noted.   ?Cardiovascular: Normal heart rate noted  ?Respiratory: Normal respiratory effort, no problems with respiration noted  ?Abdomen: Soft, gravid, appropriate for gestational age.  Pain/Pressure: Absent     ?Pelvic: Cervical exam deferred        ?Extremities: Normal range of motion.  Edema: None  ?Mental Status: Normal mood and affect. Normal behavior. Normal judgment and thought content.  ? ?Assessment and Plan:  ?Pregnancy: [id number] at [redacted]w[redacted]d ?1. Supervision of high risk pregnancy, antepartum ?- Doing well, feeling regular and vigorous fetal movement  ? ?2. [redacted] weeks gestation of pregnancy ?- Routine OB care  ?- AFP, Serum, Open Spina Bifida ? ?3. Subchorionic hemorrhage of placenta in  first trimester, single or unspecified fetus ?- No further bleeding incidents ? ?4. History of preterm birth ?- Has appt tomorrow to measure cervical length and discuss options ? ?5. Syncope, unspecified syncope type ?- Was not hungry/tired/thirsty, just got dizzy with gray spots and passed out. No evidence of seizure activity or post-ictal phase. Husband was present and lowered her to the floor without incident. ?- AMB Referral to Cardio Obstetrics ? ? ?Centering Pregnancy, Session#2: Reviewed rules for self-governance with group. Direct group to [employer].  ? ?Facilitated discussion today:  Nutrition, Back pain, Genetic screening options with AFP/Quad.  ? ?Mindfulness activity completed as well as deep breathing with 1 minutes of tension and 1 minute of relaxation for childbirth preparation.  Also participated in mindful eating activity ?Activity-demonstrated exercises to alleviate back and round ligament pain. ? ?Preterm labor symptoms and general obstetric precautions including but not limited to vaginal bleeding, contractions, leaking of fluid and fetal movement were reviewed in detail with the patient. ?Please refer to After Visit Summary for other counseling recommendations.  ? ?No follow-ups on file. ? ?Future Appointments  ?Date Time Provider Department Center  ?10/18/2021  9:30 AM [facility] NURSE [facility]  ?10/18/2021  9:45 AM [facility] US4 [facility]  ?10/19/2021  2:00 PM Tobb, Lavona Mound, DO [facility] None  ?10/23/2021  1:15 PM [facility]INIC[facility]  ?11/07/2021  9:30 AM [facility] NURSE [facility]  ?11/07/2021  9:45 AM [facility] US4 [facility]  ?11/14/2021  9:00 AM CENTERING PROVIDER [facility]  ?12/12/2021  9:00 AM CENTERING PROVIDER [facility]  ?12/13/2021  8:00 AM  [facility] None  ?12/13/2021  8:40 AM Jaci Standard, MD [facility] None  ?12/26/2021  9:00 AM CENTERING PROVIDER [facility]  ?01/09/2022  9:00 AM CENTERING PROVIDER [facility]  ?01/23/2022  9:00 AM  CENTERING PROVIDER [facility]  ?02/06/2022  9:00 AM CENTERING PROVIDER [facility]  ?02/20/2022  9:00 AM CENTERING PROVIDER [facility]  ?03/06/2022  9:00 AM CENTERING PROVIDER [facility]  ? ? ?Bernerd Limbo, CNM ?

## 2021-10-18 ENCOUNTER — Encounter: Payer: Self-pay | Admitting: *Deleted

## 2021-10-18 ENCOUNTER — Ambulatory Visit: Payer: Medicaid Other | Attending: Obstetrics and Gynecology

## 2021-10-18 ENCOUNTER — Telehealth: Payer: Self-pay

## 2021-10-18 ENCOUNTER — Ambulatory Visit: Payer: Medicaid Other | Admitting: *Deleted

## 2021-10-18 VITALS — BP 119/65 | HR 75

## 2021-10-18 DIAGNOSIS — O09522 Supervision of elderly multigravida, second trimester: Secondary | ICD-10-CM

## 2021-10-18 DIAGNOSIS — O0990 Supervision of high risk pregnancy, unspecified, unspecified trimester: Secondary | ICD-10-CM | POA: Insufficient documentation

## 2021-10-18 DIAGNOSIS — D684 Acquired coagulation factor deficiency: Secondary | ICD-10-CM | POA: Insufficient documentation

## 2021-10-18 NOTE — Telephone Encounter (Signed)
Called pt to confirm apportionment for tomorrow. She verbalized she will be there.

## 2021-10-19 ENCOUNTER — Ambulatory Visit: Payer: Medicaid Other

## 2021-10-19 ENCOUNTER — Encounter: Payer: Self-pay | Admitting: Cardiology

## 2021-10-19 ENCOUNTER — Ambulatory Visit (INDEPENDENT_AMBULATORY_CARE_PROVIDER_SITE_OTHER): Payer: Medicaid Other | Admitting: Cardiology

## 2021-10-19 VITALS — BP 110/68 | HR 79 | Ht 60.0 in | Wt 125.0 lb

## 2021-10-19 DIAGNOSIS — R55 Syncope and collapse: Secondary | ICD-10-CM | POA: Diagnosis not present

## 2021-10-19 DIAGNOSIS — Z79899 Other long term (current) drug therapy: Secondary | ICD-10-CM

## 2021-10-19 LAB — AFP, SERUM, OPEN SPINA BIFIDA
AFP MoM: 0.67
AFP Value: 29 ng/mL
Gest. Age on Collection Date: 16 weeks
Maternal Age At EDD: 37.5 yr
OSBR Risk 1 IN: 10000
Test Results:: NEGATIVE
Weight: 124 [lb_av]

## 2021-10-19 NOTE — Progress Notes (Signed)
[facility] Evaluation  Date:  10/19/2021   ID:  Michelle Guzman, DOB [birth year], MRN [id number]  PCP:  Patient, No Pcp Per (Inactive)   [place] Providers Cardiologist:  None  Electrophysiologist:  None       Referring MD: Gabriel Carina, CNM   Chief Complaint: " I passed out recently"  History of Present Illness:    Michelle Guzman is a 36 y.o. female [[id number]] who is being seen today for the evaluation of syncope at the request of Gabriel Carina, CNM.   History of iron deficiency anemia, with blood transfusion recently in April presents today to be evaluated for syncope.  She reports an episodes where she was at home earlier this week with her significant other.  She was sitting down and felt as if everything went gray with some flashing lights.  And felt like she was going to pass out.  At that time her significant other tried to take her out of the chair and walk her to the area for fresh air.  To going to the both of them she did passed out during that transition.  She is not sure how long she was down for.  Her significant other was present and also was at the time of the event denies any bowel bladder movement or any jerking movement.  Since her episode she tells me she has had significant lightheadedness.  No dizziness.  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   Prenatal Vit-Fe Fumarate-FA (PRENATAL PO) Take by mouth.     Allergies:   Lactose intolerance (gi) and Orange juice [orange oil]   Social History   Socioeconomic History   Marital status: Single    Spouse name: Not on file   Number of children: Not on file   Years of education: Not on file   Highest education level: Not on file  Occupational History   Not on file  Tobacco Use   Smoking status: Never   Smokeless tobacco: Never  Vaping Use   Vaping Use: Never used  Substance and Sexual Activity   Alcohol use: Not Currently   Drug use: Not Currently    Types: Marijuana    Comment: stopped a couple weeks ago per pt on 09/23/19   Sexual activity: Yes    Birth control/protection: None  Other Topics Concern   Not on file  Social History  Narrative   Not on file   Social Determinants of Health   Financial Resource Strain: Not on file  Food Insecurity: No Food Insecurity   Worried About Charity fundraiser in the Last Year: Never true   Ran Out of Food in the Last Year: Never true  Transportation Needs: Unmet Transportation Needs   Lack of Transportation (Medical): Yes   Lack of Transportation (Non-Medical): Yes  Physical Activity: Not on file  Stress: Not on file  Social Connections: Not on file      Family History  Problem Relation Age of Onset   Lupus Mother    Drug abuse Mother    Hypertension Mother    Mental illness Mother    Drug abuse Father    Heart disease Father       ROS:   Review of Systems  Constitution: Report recent syncope.  Negative for decreased appetite, fever and weight gain.  HENT: Negative for congestion, ear discharge, hoarse voice and sore throat.   Eyes: Negative for discharge, redness, vision loss in right eye and visual halos.   Cardiovascular: Negative for chest pain, dyspnea on exertion, leg swelling, orthopnea and palpitations.  Respiratory: Negative for cough, hemoptysis, shortness of breath and snoring.   Endocrine: Negative for heat intolerance and polyphagia.  Hematologic/Lymphatic: Negative for bleeding problem. Does not bruise/bleed easily.  Skin: Negative for flushing, nail changes, rash and suspicious lesions.  Musculoskeletal: Negative for arthritis, joint pain, muscle cramps, myalgias, neck pain and stiffness.  Gastrointestinal: Negative for abdominal pain, bowel incontinence, diarrhea and excessive appetite.  Genitourinary: Negative for decreased libido, genital sores and incomplete emptying.  Neurological: Negative for brief paralysis, focal weakness, headaches and loss of balance.  Psychiatric/Behavioral: Negative for altered mental status, depression and suicidal ideas.  Allergic/Immunologic: Negative for HIV exposure and persistent infections.     Labs/EKG Reviewed:    EKG:   EKG is was ordered today.  The ekg ordered today demonstrates sinus rhythm with arrhythmia, heart rate 79 bpm.  Recent Labs: 10/01/2021: ALT 10; BUN 5; Creatinine 0.46; Hemoglobin 10.2; Platelet Count 281; Potassium 3.9; Sodium 134   Recent Lipid Panel No results found for: CHOL, TRIG, HDL, CHOLHDL, LDLCALC, LDLDIRECT  Physical Exam:    VS:  BP 110/68   Pulse 79   Ht 5' (1.524 m)   Wt 125 lb (56.7 kg)   LMP 06/27/2021   SpO2 99%   BMI 24.41 kg/m     Wt Readings from Last 3 Encounters:  10/19/21 125 lb (56.7 kg)  10/17/21 124 lb 6.4 oz (56.4 kg)  10/01/21 122 lb 4.8 oz (55.5 kg)     GEN:  Well nourished, well developed in no acute distress HEENT: Normal NECK: No JVD; No carotid bruits LYMPHATICS: No lymphadenopathy CARDIAC: RRR, no murmurs, rubs, gallops RESPIRATORY:  Clear to auscultation without rales, wheezing or rhonchi  ABDOMEN: Soft, non-tender, non-distended MUSCULOSKELETAL:  No edema; No  deformity  SKIN: Warm and dry NEUROLOGIC:  Alert and oriented x 3 PSYCHIATRIC:  Normal affect    Risk Assessment/Risk Calculators:     CARPREG II Risk Prediction Index Score:  1.  The patient's risk for a primary cardiac event is 5%.            ASSESSMENT & PLAN:    Syncope-I am going to first get a CBC as well as BMP for this patient.  She recently was treated for iron deficiency anemia her hemoglobin in April was 6.1. In addition, I would  like to rule out a cardiovascular etiology of this syncope, therefore at this time I would like to placed a zio patch for 14  days. In additon a transthoracic echocardiogram will be ordered to assess LV/RV function and any structural abnormalities. Once these testing have been performed amd reviewed further reccomendations will be made. For now, I do reccomend that the patient goes to the nearest ED if  symptoms recur.  Patient Instructions  Medication Instructions:  Your physician recommends that you continue on your current medications as directed. Please refer to the Current Medication list given to you today.  *If you need a refill on your cardiac medications before your next appointment, please call your pharmacy*   Lab Work: Your physician recommends that you return for lab work in:  TODAY: CMET, Mag, CBC If you have labs (blood work) drawn today and your tests are completely normal, you will receive your results only by: [place] (if you have [place]) OR A paper copy in the mail If you have any lab test that is abnormal or we need to change your treatment, we will call you to review the results.   Testing/Procedures: Your physician has requested that you have an echocardiogram. Echocardiography is a painless test that uses sound waves to create images of your heart. It provides your doctor with information about the size and shape of your heart and how well your heart's chambers and valves are working. This procedure takes  approximately one hour. There are no restrictions for this procedure.   ZIO AT Long term monitor-Live Telemetry  Your physician has requested you wear a ZIO patch monitor for 14 days.  This is a single patch monitor. [employer] supplies one patch monitor per enrollment. Additional  stickers are not available.  Please do not apply patch if you will be having a Nuclear Stress Test, Echocardiogram, Cardiac CT, MRI,  or Chest Xray during the period you would be wearing the monitor. The patch cannot be worn during  these tests. You cannot remove and re-apply the ZIO AT patch monitor.  Your ZIO patch monitor will be mailed 3 day USPS to your address on file. It may take 3-5 days to  receive your monitor after you have been enrolled.  Once you have received your monitor, please review the enclosed instructions. Your monitor has  already been registered assigning a specific monitor serial # to you.   Billing and Patient Assistance Program information  Theodore Demark has been supplied with any insurance information on record for billing. [employer] offers a sliding scale Patient Assistance Program for patients without insurance, or whose  insurance does not completely cover the cost of the ZIO patch monitor. You must apply for the  Patient Assistance Program to qualify for the discounted rate. To apply, call [employer] at [phone],  select option 4, select option 2 , ask to apply for the Patient Assistance Program, (you can request an  interpreter if needed). [employer] will ask your household income and how many people are in your  household. [employer] will quote your out-of-pocket cost based on this information. They will also be able  to set up a 12 month interest free payment plan if needed.  Applying the monitor   Shave hair from upper left chest.  Hold the abrader disc by orange tab. Rub the abrader in 40 strokes over left upper chest as indicated in  your monitor instructions.  Clean area with 4 enclosed  alcohol pads. Use all pads to ensure  the area is cleaned thoroughly. Let  dry.  Apply patch as indicated in monitor instructions. Patch will be placed under collarbone on left side of  chest with arrow pointing upward.  Rub patch adhesive wings for 2 minutes. Remove the white label marked "1". Remove the white label  marked "2". Rub patch adhesive wings for 2 additional minutes.  While looking in a mirror, press and release button in center of patch. A small green light will flash 3-4  times. This will be your only indicator that the monitor has been turned on.  Do not shower for the first 24 hours. You may shower after the first 24 hours.  Press the button if you feel a symptom. You will hear a small click. Record Date, Time and Symptom in  the Patient Log.   Starting the Gateway  In your kit there is a Hydrographic surveyor box the size of a cellphone. This is Airline pilot. It transmits all your  recorded data to [facility]. This box must always stay within 10 feet of you. Open the box and push the *  button. There will be a light that blinks orange and then green a few times. When the light stops  blinking, the Gateway is connected to the ZIO patch. Call [employer] at [phone] to confirm your monitor is transmitting.  Returning your monitor  Remove your patch and place it inside the [place]. In the lower half of the Gateway there is a white  bag with prepaid postage on it. Place Gateway in bag and seal. Mail package back to [place] as soon as  possible. Your physician should have your final report approximately 7 days after you have mailed back  your monitor. Call [place] at [phone] if you have questions regarding your ZIO AT  patch monitor. Call them immediately if you see an orange light blinking on your monitor.  If your monitor falls off in less than 4 days, contact our Monitor department at [phone]. If your  monitor becomes loose or falls off after 4  days call [employer] at [phone] for suggestions on  securing your monitor  Follow-Up: At [facility], you and your health needs are our priority.  As part of our continuing mission to provide you with exceptional heart care, we have created designated Provider Care Teams.  These Care Teams include your primary Cardiologist (physician) and Advanced Practice Providers (APPs -  Physician Assistants and Nurse Practitioners) who all work together to provide you with the care you need, when you need it.  We recommend signing up for the patient portal called "MyChart".  Sign up information is provided on this After Visit Summary.  MyChart is used to connect with patients for Virtual Visits (Telemedicine).  Patients are able to view lab/test results, encounter notes, upcoming appointments, etc.  Non-urgent messages can be sent to your provider as well.   To learn more about what you can do with MyChart, go to [email or web address].    Your next appointment:   16 week(s)  The format for your next appointment:   In Person  Provider:   Berniece Salines  [facility] [address], [place], [place] [address]  Other Instructions   Important Information About Sugar         Dispo:  Return in about 16 weeks (around 02/08/2022).   Medication Adjustments/Labs and Tests Ordered: Current medicines are reviewed at length with the patient today.  Concerns regarding medicines are outlined above.  Tests  Ordered: Orders Placed This Encounter  Procedures   Comp Met (CMET)   CBC with Differential/Platelet   Magnesium   LONG TERM MONITOR-LIVE TELEMETRY (3-14 DAYS)   ECHOCARDIOGRAM COMPLETE   Medication Changes: No orders of the defined types were placed in this encounter.

## 2021-10-19 NOTE — Patient Instructions (Addendum)
Medication Instructions:  Your physician recommends that you continue on your current medications as directed. Please refer to the Current Medication list given to you today.  *If you need a refill on your cardiac medications before your next appointment, please call your pharmacy*   Lab Work: Your physician recommends that you return for lab work in:  TODAY: CMET, Mag, CBC If you have labs (blood work) drawn today and your tests are completely normal, you will receive your results only by: [place] (if you have [place]) OR A paper copy in the mail If you have any lab test that is abnormal or we need to change your treatment, we will call you to review the results.   Testing/Procedures: Your physician has requested that you have an echocardiogram. Echocardiography is a painless test that uses sound waves to create images of your heart. It provides your doctor with information about the size and shape of your heart and how well your heart's chambers and valves are working. This procedure takes approximately one hour. There are no restrictions for this procedure.   ZIO AT Long term monitor-Live Telemetry  Your physician has requested you wear a ZIO patch monitor for 14 days.  This is a single patch monitor. [employer] supplies one patch monitor per enrollment. Additional  stickers are not available.  Please do not apply patch if you will be having a Nuclear Stress Test, Echocardiogram, Cardiac CT, MRI,  or Chest Xray during the period you would be wearing the monitor. The patch cannot be worn during  these tests. You cannot remove and re-apply the ZIO AT patch monitor.  Your ZIO patch monitor will be mailed 3 day USPS to your address on file. It may take 3-5 days to  receive your monitor after you have been enrolled.  Once you have received your monitor, please review the enclosed instructions. Your monitor has  already been registered assigning a specific monitor serial # to you.    Billing and Patient Assistance Program information  Michelle Guzman has been supplied with any insurance information on record for billing. [employer] offers a sliding scale Patient Assistance Program for patients without insurance, or whose  insurance does not completely cover the cost of the ZIO patch monitor. You must apply for the  Patient Assistance Program to qualify for the discounted rate. To apply, call [employer] at [phone],  select option 4, select option 2 , ask to apply for the Patient Assistance Program, (you can request an  interpreter if needed). [employer] will ask your household income and how many people are in your  household. [employer] will quote your out-of-pocket cost based on this information. They will also be able  to set up a 12 month interest free payment plan if needed.  Applying the monitor   Shave hair from upper left chest.  Hold the abrader disc by orange tab. Rub the abrader in 40 strokes over left upper chest as indicated in  your monitor instructions.  Clean area with 4 enclosed alcohol pads. Use all pads to ensure the area is cleaned thoroughly. Let  dry.  Apply patch as indicated in monitor instructions. Patch will be placed under collarbone on left side of  chest with arrow pointing upward.  Rub patch adhesive wings for 2 minutes. Remove the white label marked "1". Remove the white label  marked "2". Rub patch adhesive wings for 2 additional minutes.  While looking in a mirror, press and release button in center of patch. A small  green light will flash 3-4  times. This will be your only indicator that the monitor has been turned on.  Do not shower for the first 24 hours. You may shower after the first 24 hours.  Press the button if you feel a symptom. You will hear a small click. Record Date, Time and Symptom in  the Patient Log.   Starting the Gateway  In your kit there is a Hydrographic surveyor box the size of a cellphone. This is Airline pilot. It transmits all  your  recorded data to [facility]. This box must always stay within 10 feet of you. Open the box and push the *  button. There will be a light that blinks orange and then green a few times. When the light stops  blinking, the Gateway is connected to the ZIO patch. Call [employer] at [phone] to confirm your monitor is transmitting.  Returning your monitor  Remove your patch and place it inside the [place]. In the lower half of the Gateway there is a white  bag with prepaid postage on it. Place Gateway in bag and seal. Mail package back to [place] as soon as  possible. Your physician should have your final report approximately 7 days after you have mailed back  your monitor. Call [place] at [phone] if you have questions regarding your ZIO AT  patch monitor. Call them immediately if you see an orange light blinking on your monitor.  If your monitor falls off in less than 4 days, contact our Monitor department at [phone]. If your  monitor becomes loose or falls off after 4 days call [employer] at [phone] for suggestions on  securing your monitor  Follow-Up: At [facility], you and your health needs are our priority.  As part of our continuing mission to provide you with exceptional heart care, we have created designated Provider Care Teams.  These Care Teams include your primary Cardiologist (physician) and Advanced Practice Providers (APPs -  Physician Assistants and Nurse Practitioners) who all work together to provide you with the care you need, when you need it.  We recommend signing up for the patient portal called "MyChart".  Sign up information is provided on this After Visit Summary.  MyChart is used to connect with patients for Virtual Visits (Telemedicine).  Patients are able to view lab/test results, encounter notes, upcoming appointments, etc.  Non-urgent messages can be sent to your provider as well.   To learn more about what you can do  with MyChart, go to [email or web address].    Your next appointment:   16 week(s)  The format for your next appointment:   In Person  Provider:   Berniece Salines  [facility] [address], [place], [place] [address]  Other Instructions   Important Information About Sugar

## 2021-10-23 ENCOUNTER — Ambulatory Visit (INDEPENDENT_AMBULATORY_CARE_PROVIDER_SITE_OTHER): Payer: Medicaid Other | Admitting: Clinical

## 2021-10-23 DIAGNOSIS — F4323 Adjustment disorder with mixed anxiety and depressed mood: Secondary | ICD-10-CM

## 2021-11-02 ENCOUNTER — Ambulatory Visit (HOSPITAL_COMMUNITY): Payer: Medicaid Other | Attending: Cardiology

## 2021-11-02 DIAGNOSIS — R55 Syncope and collapse: Secondary | ICD-10-CM | POA: Diagnosis not present

## 2021-11-02 LAB — ECHOCARDIOGRAM COMPLETE
Area-P 1/2: 3.85 cm2
S' Lateral: 3.4 cm

## 2021-11-02 NOTE — Addendum Note (Signed)
Addended by: Daryll Brod on: 11/02/2021 01:53 PM   Modules accepted: Orders

## 2021-11-07 ENCOUNTER — Ambulatory Visit: Payer: Medicaid Other | Admitting: *Deleted

## 2021-11-07 ENCOUNTER — Encounter: Payer: Self-pay | Admitting: *Deleted

## 2021-11-07 ENCOUNTER — Other Ambulatory Visit: Payer: Self-pay | Admitting: *Deleted

## 2021-11-07 ENCOUNTER — Other Ambulatory Visit: Payer: Self-pay | Admitting: Obstetrics and Gynecology

## 2021-11-07 ENCOUNTER — Ambulatory Visit: Payer: Medicaid Other | Attending: Obstetrics and Gynecology

## 2021-11-07 VITALS — BP 106/60 | HR 79

## 2021-11-07 DIAGNOSIS — O0990 Supervision of high risk pregnancy, unspecified, unspecified trimester: Secondary | ICD-10-CM | POA: Diagnosis present

## 2021-11-07 DIAGNOSIS — O418X10 Other specified disorders of amniotic fluid and membranes, first trimester, not applicable or unspecified: Secondary | ICD-10-CM

## 2021-11-07 DIAGNOSIS — O09522 Supervision of elderly multigravida, second trimester: Secondary | ICD-10-CM | POA: Diagnosis present

## 2021-11-07 DIAGNOSIS — D684 Acquired coagulation factor deficiency: Secondary | ICD-10-CM | POA: Insufficient documentation

## 2021-11-07 DIAGNOSIS — O09521 Supervision of elderly multigravida, first trimester: Secondary | ICD-10-CM | POA: Insufficient documentation

## 2021-11-07 DIAGNOSIS — O09892 Supervision of other high risk pregnancies, second trimester: Secondary | ICD-10-CM

## 2021-11-07 DIAGNOSIS — O09292 Supervision of pregnancy with other poor reproductive or obstetric history, second trimester: Secondary | ICD-10-CM

## 2021-11-07 DIAGNOSIS — O468X1 Other antepartum hemorrhage, first trimester: Secondary | ICD-10-CM | POA: Insufficient documentation

## 2021-11-13 ENCOUNTER — Telehealth: Payer: Self-pay

## 2021-11-13 NOTE — Progress Notes (Unsigned)
   PRENATAL VISIT NOTE  Subjective:  Michelle Guzman is a 36 y.o. [id number] at 102w6d being seen today for ongoing prenatal care.  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  Patient reports {[id number]}.   .  .   . Denies leaking of fluid.   The following portions of the patient's history were reviewed and updated as appropriate: allergies, current medications, past family history, past medical history, past social history, past surgical history and problem list.   Objective:  There were no vitals filed for this visit.  Fetal Status:           General:  Alert, oriented and cooperative. Patient is in no acute distress.  Skin: Skin is warm and dry. No rash noted.   Cardiovascular: Normal heart rate noted  Respiratory: Normal respiratory effort, no problems with respiration noted  Abdomen: Soft, gravid, appropriate for gestational age.        Pelvic: {Blank single:19197::"Cervical exam performed in the presence of a chaperone","Cervical exam deferred"}        Extremities: Normal range of motion.     Mental Status: Normal mood and affect. Normal behavior. Normal judgment and thought content.   Assessment and Plan:  Pregnancy: [id number] at [redacted]w[redacted]d 1. Supervision of high risk pregnancy in second trimester ***  2. [redacted] weeks gestation of pregnancy ***  3. Multigravida of advanced maternal age in second trimester ***  4. History of preterm delivery ***   Centering Pregnancy, Session#3: Reviewed resources in [employer].   Facilitated discussion today:  Stress/Stress reduction and breastfeeding/infant nutrition Mindfulness  activity completed as well as deep breathing with still touch for childbirth preparation.    Fundal height and FHR appropriate today unless noted otherwise in plan. Patient to continue group care.   {Blank single:19197::"Term","Preterm"} labor symptoms and general obstetric precautions including but not limited to vaginal bleeding, contractions, leaking of fluid and fetal movement were reviewed in detail with the patient. Please refer to After Visit Summary for other counseling recommendations.   No follow-ups on file.  Future Appointments  Date Time Provider [place]  11/14/2021  9:00 AM Gabriel Carina, CNM [facility]  11/15/2021  2:30 PM [facility] NURSE [facility]  11/15/2021  2:45 PM [facility] US7 [facility]  12/06/2021  8:45 AM [facility] NURSE [facility] [place]  12/06/2021  9:00 AM [facility] US1 [facility] [place]  12/12/2021  9:00 AM CENTERING PROVIDER [facility]  12/13/2021  8:00 AM [facility] LAB [facility] None  12/13/2021  8:40 AM Orson Slick, MD [facility] None  12/26/2021  9:00 AM CENTERING PROVIDER [facility]  01/09/2022  9:00 AM CENTERING PROVIDER [facility]  01/23/2022  9:00 AM CENTERING PROVIDER [facility]  02/06/2022  9:00 AM CENTERING PROVIDER [facility]  02/08/2022  1:20 PM Tobb, Kardie, DO [facility] None  02/20/2022  9:00 AM CENTERING PROVIDER [facility]  03/06/2022  9:00 AM CENTERING PROVIDER [facility] [place]    Gabriel Carina, CNM

## 2021-11-13 NOTE — Telephone Encounter (Signed)
Called Pt to remind of Centering for tomorrow, Pt states will be there.

## 2021-11-14 ENCOUNTER — Ambulatory Visit (INDEPENDENT_AMBULATORY_CARE_PROVIDER_SITE_OTHER): Payer: Medicaid Other | Admitting: Certified Nurse Midwife

## 2021-11-14 VITALS — BP 112/66 | HR 72 | Wt 134.2 lb

## 2021-11-14 DIAGNOSIS — O09522 Supervision of elderly multigravida, second trimester: Secondary | ICD-10-CM

## 2021-11-14 DIAGNOSIS — Z3A20 20 weeks gestation of pregnancy: Secondary | ICD-10-CM

## 2021-11-14 DIAGNOSIS — O0992 Supervision of high risk pregnancy, unspecified, second trimester: Secondary | ICD-10-CM

## 2021-11-14 DIAGNOSIS — Z8751 Personal history of pre-term labor: Secondary | ICD-10-CM

## 2021-11-15 ENCOUNTER — Ambulatory Visit: Payer: Medicaid Other | Admitting: *Deleted

## 2021-11-15 ENCOUNTER — Encounter: Payer: Self-pay | Admitting: *Deleted

## 2021-11-15 ENCOUNTER — Ambulatory Visit: Payer: Medicaid Other | Attending: Maternal & Fetal Medicine

## 2021-11-15 VITALS — BP 111/56 | HR 70

## 2021-11-15 DIAGNOSIS — O4592 Premature separation of placenta, unspecified, second trimester: Secondary | ICD-10-CM

## 2021-11-15 DIAGNOSIS — O99282 Endocrine, nutritional and metabolic diseases complicating pregnancy, second trimester: Secondary | ICD-10-CM

## 2021-11-15 DIAGNOSIS — O09292 Supervision of pregnancy with other poor reproductive or obstetric history, second trimester: Secondary | ICD-10-CM

## 2021-11-15 DIAGNOSIS — O09522 Supervision of elderly multigravida, second trimester: Secondary | ICD-10-CM

## 2021-11-15 DIAGNOSIS — O3432 Maternal care for cervical incompetence, second trimester: Secondary | ICD-10-CM | POA: Diagnosis not present

## 2021-11-15 DIAGNOSIS — E561 Deficiency of vitamin K: Secondary | ICD-10-CM | POA: Diagnosis not present

## 2021-11-15 DIAGNOSIS — O0990 Supervision of high risk pregnancy, unspecified, unspecified trimester: Secondary | ICD-10-CM | POA: Diagnosis present

## 2021-11-15 DIAGNOSIS — D684 Acquired coagulation factor deficiency: Secondary | ICD-10-CM

## 2021-11-15 DIAGNOSIS — O285 Abnormal chromosomal and genetic finding on antenatal screening of mother: Secondary | ICD-10-CM

## 2021-11-15 DIAGNOSIS — Z3A20 20 weeks gestation of pregnancy: Secondary | ICD-10-CM

## 2021-11-15 DIAGNOSIS — D563 Thalassemia minor: Secondary | ICD-10-CM

## 2021-11-15 DIAGNOSIS — O09212 Supervision of pregnancy with history of pre-term labor, second trimester: Secondary | ICD-10-CM

## 2021-11-15 DIAGNOSIS — O09892 Supervision of other high risk pregnancies, second trimester: Secondary | ICD-10-CM | POA: Diagnosis present

## 2021-12-06 ENCOUNTER — Ambulatory Visit: Payer: Medicaid Other | Attending: Maternal & Fetal Medicine

## 2021-12-06 ENCOUNTER — Ambulatory Visit: Payer: Medicaid Other | Admitting: *Deleted

## 2021-12-06 VITALS — BP 112/61 | HR 82

## 2021-12-06 DIAGNOSIS — Z3689 Encounter for other specified antenatal screening: Secondary | ICD-10-CM | POA: Insufficient documentation

## 2021-12-06 DIAGNOSIS — O4592 Premature separation of placenta, unspecified, second trimester: Secondary | ICD-10-CM | POA: Diagnosis not present

## 2021-12-06 DIAGNOSIS — O3432 Maternal care for cervical incompetence, second trimester: Secondary | ICD-10-CM | POA: Diagnosis not present

## 2021-12-06 DIAGNOSIS — O285 Abnormal chromosomal and genetic finding on antenatal screening of mother: Secondary | ICD-10-CM

## 2021-12-06 DIAGNOSIS — O09892 Supervision of other high risk pregnancies, second trimester: Secondary | ICD-10-CM | POA: Diagnosis present

## 2021-12-06 DIAGNOSIS — O09522 Supervision of elderly multigravida, second trimester: Secondary | ICD-10-CM | POA: Diagnosis present

## 2021-12-06 DIAGNOSIS — D563 Thalassemia minor: Secondary | ICD-10-CM

## 2021-12-06 DIAGNOSIS — O09292 Supervision of pregnancy with other poor reproductive or obstetric history, second trimester: Secondary | ICD-10-CM | POA: Insufficient documentation

## 2021-12-06 DIAGNOSIS — Z3A23 23 weeks gestation of pregnancy: Secondary | ICD-10-CM

## 2021-12-12 ENCOUNTER — Encounter: Payer: Self-pay | Admitting: Certified Nurse Midwife

## 2021-12-12 ENCOUNTER — Ambulatory Visit: Payer: Medicaid Other | Admitting: Certified Nurse Midwife

## 2021-12-12 DIAGNOSIS — O0992 Supervision of high risk pregnancy, unspecified, second trimester: Secondary | ICD-10-CM

## 2021-12-12 DIAGNOSIS — Z8751 Personal history of pre-term labor: Secondary | ICD-10-CM

## 2021-12-12 DIAGNOSIS — O09522 Supervision of elderly multigravida, second trimester: Secondary | ICD-10-CM

## 2021-12-12 DIAGNOSIS — Z3A24 24 weeks gestation of pregnancy: Secondary | ICD-10-CM

## 2021-12-12 NOTE — Progress Notes (Signed)
Unable to attend today (NOT a no-show)

## 2021-12-13 ENCOUNTER — Telehealth: Payer: Self-pay | Admitting: *Deleted

## 2021-12-13 ENCOUNTER — Other Ambulatory Visit: Payer: Self-pay

## 2021-12-13 ENCOUNTER — Inpatient Hospital Stay: Payer: Medicaid Other | Attending: Hematology and Oncology | Admitting: Hematology and Oncology

## 2021-12-13 ENCOUNTER — Inpatient Hospital Stay: Payer: Medicaid Other

## 2021-12-13 ENCOUNTER — Other Ambulatory Visit: Payer: Self-pay | Admitting: Hematology and Oncology

## 2021-12-13 VITALS — BP 106/62 | HR 72 | Temp 98.3°F | Resp 17 | Ht 60.0 in | Wt 135.9 lb

## 2021-12-13 DIAGNOSIS — D509 Iron deficiency anemia, unspecified: Secondary | ICD-10-CM | POA: Insufficient documentation

## 2021-12-13 DIAGNOSIS — D684 Acquired coagulation factor deficiency: Secondary | ICD-10-CM

## 2021-12-13 DIAGNOSIS — D500 Iron deficiency anemia secondary to blood loss (chronic): Secondary | ICD-10-CM

## 2021-12-13 DIAGNOSIS — O99013 Anemia complicating pregnancy, third trimester: Secondary | ICD-10-CM | POA: Insufficient documentation

## 2021-12-13 LAB — RETIC PANEL
Immature Retic Fract: 19.4 % — ABNORMAL HIGH (ref 2.3–15.9)
RBC.: 3.32 MIL/uL — ABNORMAL LOW (ref 3.87–5.11)
Retic Count, Absolute: 44.5 10*3/uL (ref 19.0–186.0)
Retic Ct Pct: 1.3 % (ref 0.4–3.1)
Reticulocyte Hemoglobin: 31.7 pg (ref >27.9)

## 2021-12-13 LAB — CBC WITH DIFFERENTIAL (CANCER CENTER ONLY)
Abs Immature Granulocytes: 0.04 10*3/uL (ref 0.00–0.07)
Basophils Absolute: 0 10*3/uL (ref 0.0–0.1)
Basophils Relative: 0 %
Eosinophils Absolute: 0.1 10*3/uL (ref 0.0–0.5)
Eosinophils Relative: 2 %
HCT: 29.6 % — ABNORMAL LOW (ref 36.0–46.0)
Hemoglobin: 9.5 g/dL — ABNORMAL LOW (ref 12.0–15.0)
Immature Granulocytes: 1 %
Lymphocytes Relative: 28 %
Lymphs Abs: 2.2 10*3/uL (ref 0.7–4.0)
MCH: 28.6 pg (ref 26.0–34.0)
MCHC: 32.1 g/dL (ref 30.0–36.0)
MCV: 89.2 fL (ref 80.0–100.0)
Monocytes Absolute: 0.9 10*3/uL (ref 0.1–1.0)
Monocytes Relative: 12 %
Neutro Abs: 4.4 10*3/uL (ref 1.7–7.7)
Neutrophils Relative %: 57 %
Platelet Count: 296 10*3/uL (ref 150–400)
RBC: 3.32 MIL/uL — ABNORMAL LOW (ref 3.87–5.11)
RDW: 12.4 % (ref 11.5–15.5)
WBC Count: 7.6 10*3/uL (ref 4.0–10.5)
nRBC: 0 % (ref 0.0–0.2)

## 2021-12-13 LAB — CMP (CANCER CENTER ONLY)
ALT: 8 U/L (ref 0–44)
AST: 10 U/L — ABNORMAL LOW (ref 15–41)
Albumin: 3.5 g/dL (ref 3.5–5.0)
Alkaline Phosphatase: 74 U/L (ref 38–126)
Anion gap: 5 (ref 5–15)
BUN: 6 mg/dL (ref 6–20)
CO2: 26 mmol/L (ref 22–32)
Calcium: 8.6 mg/dL — ABNORMAL LOW (ref 8.9–10.3)
Chloride: 104 mmol/L (ref 98–111)
Creatinine: 0.55 mg/dL (ref 0.44–1.00)
GFR, Estimated: >60 mL/min (ref >60)
Glucose, Bld: 77 mg/dL (ref 70–99)
Potassium: 3.7 mmol/L (ref 3.5–5.1)
Sodium: 135 mmol/L (ref 135–145)
Total Bilirubin: 0.4 mg/dL (ref 0.3–1.2)
Total Protein: 6.5 g/dL (ref 6.5–8.1)

## 2021-12-13 LAB — IRON AND IRON BINDING CAPACITY (CC-WL,HP ONLY)
Iron: 94 ug/dL (ref 28–170)
Saturation Ratios: 21 % (ref 10.4–31.8)
TIBC: 441 ug/dL (ref 250–450)
UIBC: 347 ug/dL (ref 148–442)

## 2021-12-13 LAB — FERRITIN: Ferritin: 6 ng/mL — ABNORMAL LOW (ref 11–307)

## 2021-12-13 NOTE — Progress Notes (Signed)
[facility] [phone]   [phone]  PROGRESS NOTE  Patient Care Team: Patient, No Pcp Per as PCP - General (General Practice) Milas Hock, MD as PCP - OBGYN (Obstetrics and Gynecology)  Hematological/Oncological History # Concern for Coagulopathy # Iron Deficiency Anemia in Pregnancy 10/01/2021: establish care with Dr. Leonides Schanz. Hgb 10.2 12/13/2021: WBC 7.6, Hgb 9.5, MCV 89.2, Plt 296, ferritin 6  Interval History:  Michelle Guzman 36 y.o. female with medical history significant for deficiency anemia in pregnancy who presents for a follow up visit. The patient's last visit was on 10/01/2021 at which time she established care. In the interim since the last visit she has had no major changes in her health.  Michelle Guzman currently reports that she is currently due in September/August 2023.  She notes her energy levels are "okay".  She notes that her energy is currently a 7 out of 10.  She does not have any issues with shortness of breath, lightheadedness, or dizziness.  She reports that she is also not having any trouble with bleeding or bruising at this time.  She is taking a "super prenatal" vitamin that has iron in it.  She notes that she intends to go forward with a natural delivery.  She does have some periodic headache and some occasional leg swelling.  She reports that overall she feels quite well and is looking forward to upcoming delivery.  She otherwise denies any fevers, chills, sweats, nausea, ming or diarrhea.  A full 10 point ROS is listed below  MEDICAL HISTORY:  Past Medical History:  Diagnosis Date   Medical history non-contributory    Vitamin K deficiency coagulation disorder (HCC)     SURGICAL HISTORY: Past Surgical History:  Procedure Laterality Date   DILATION AND CURETTAGE OF UTERUS N/A 09/24/2019   Procedure: SUCTION DILATATION AND CURETTAGE;  Surgeon: Reva Bores, MD;  Location: [facility] OR;  Service: Gynecology;   Laterality: N/A;   NO PAST SURGERIES      SOCIAL HISTORY: Social History   Socioeconomic History   Marital status: Single    Spouse name: Not on file   Number of children: Not on file   Years of education: Not on file   Highest education level: Not on file  Occupational History   Not on file  Tobacco Use   Smoking status: Never   Smokeless tobacco: Never  Vaping Use   Vaping Use: Never used  Substance and Sexual Activity   Alcohol use: Not Currently   Drug use: Not Currently    Types: Marijuana    Comment: stopped a couple weeks ago per pt on 09/23/19   Sexual activity: Yes    Birth control/protection: None  Other Topics Concern   Not on file  Social History Narrative   Not on file   Social Determinants of Health   Financial Resource Strain: Not on file  Food Insecurity: No Food Insecurity (09/19/2021)   Hunger Vital Sign    Worried About Running Out of Food in the Last Year: Never true    Ran Out of Food in the Last Year: Never true  Transportation Needs: Unmet Transportation Needs (09/19/2021)   PRAPARE - Administrator, Civil Service (Medical): Yes    Lack of Transportation (Non-Medical): Yes  Physical Activity: Not on file  Stress: Not on file  Social Connections: Not on file  Intimate Partner Violence: Not on file    FAMILY HISTORY: Family History  Problem  Relation Age of Onset   Lupus Mother    Drug abuse Mother    Hypertension Mother    Mental illness Mother    Drug abuse Father    Heart disease Father     ALLERGIES:  is allergic to lactose intolerance (gi) and orange juice [orange oil].  MEDICATIONS:  Current Outpatient Medications  Medication Sig Dispense Refill   Blood Pressure Monitoring DEVI 1 each by Does not apply route once a week. 1 each 0   Prenatal Vit-Fe Fumarate-FA (PRENATAL PO) Take by mouth.     No current facility-administered medications for this visit.    REVIEW OF SYSTEMS:   Constitutional: ( - ) fevers, ( - )   chills , ( - ) night sweats Eyes: ( - ) blurriness of vision, ( - ) double vision, ( - ) watery eyes Ears, nose, mouth, throat, and face: ( - ) mucositis, ( - ) sore throat Respiratory: ( - ) cough, ( - ) dyspnea, ( - ) wheezes Cardiovascular: ( - ) palpitation, ( - ) chest discomfort, ( - ) lower extremity swelling Gastrointestinal:  ( - ) nausea, ( - ) heartburn, ( - ) change in bowel habits Skin: ( - ) abnormal skin rashes Lymphatics: ( - ) new lymphadenopathy, ( - ) easy bruising Neurological: ( - ) numbness, ( - ) tingling, ( - ) new weaknesses Behavioral/Psych: ( - ) mood change, ( - ) new changes  All other systems were reviewed with the patient and are negative.  PHYSICAL EXAMINATION: Vitals:   12/13/21 0825  BP: 106/62  Pulse: 72  Resp: 17  Temp: 98.3 F (36.8 C)  SpO2: 100%   Filed Weights   12/13/21 0825  Weight: 135 lb 14.4 oz (61.6 kg)    GENERAL: Well-appearing middle-aged African-American female, alert, no distress and comfortable SKIN: skin color, texture, turgor are normal, no rashes or significant lesions EYES: conjunctiva are pink and non-injected, sclera clear LUNGS: clear to auscultation and percussion with normal breathing effort HEART: regular rate & rhythm and no murmurs and no lower extremity edema Musculoskeletal: no cyanosis of digits and no clubbing  PSYCH: alert & oriented x 3, fluent speech NEURO: no focal motor/sensory deficits  LABORATORY DATA:  I have reviewed the data as listed    Latest Ref Rng & Units 12/13/2021    8:02 AM 10/01/2021    9:43 AM 09/13/2021   10:59 AM  CBC  WBC 4.0 - 10.5 K/uL 7.6  7.2  6.2   Hemoglobin 12.0 - 15.0 g/dL 9.5  70.6  23.7   Hematocrit 36.0 - 46.0 % 29.6  30.5  35.6   Platelets 150 - 400 K/uL 296  281  364        Latest Ref Rng & Units 12/13/2021    8:02 AM 10/01/2021    9:43 AM 06/09/2020   12:22 AM  CMP  Glucose 70 - 99 mg/dL 77  80  95   BUN 6 - 20 mg/dL 6  5  10    Creatinine 0.44 - 1.00 mg/dL   6.28  3.15   Sodium 135 - 145 mmol/L 135  134  140   Potassium 3.5 - 5.1 mmol/L 3.7  3.9  3.6   Chloride 98 - 111 mmol/L 104  105  106   CO2 22 - 32 mmol/L 26  26  25    Calcium 8.9 - 10.3 mg/dL 8.6  8.9  8.7   Total Protein 6.5 -  8.1 g/dL 6.5  6.4  6.5   Total Bilirubin 0.3 - 1.2 mg/dL 0.4  0.3  0.8   Alkaline Phos 38 - 126 U/L 74  46  35   AST 15 - 41 U/L 10  10  12    ALT 0 - 44 U/L 8  10  11      RADIOGRAPHIC STUDIES: MFM OB FOLLOW UP  Result Date: 12/06/2021 ----------------------------------------------------------------------  OBSTETRICS REPORT                       (Signed Final 12/06/2021 05:15 pm) ---------------------------------------------------------------------- Patient Info  ID #:       02/06/2022                          D.O.B.:  09/07/1985 (36 yrs)  Name:       [id number]                    Visit Date: 12/06/2021 09:03 am              Michelle Guzman ---------------------------------------------------------------------- Performed By  Attending:        Joslyn Devon      Ref. Address:     [address]                    [place]                                                             [place], [address]                                                             [place]  Performed By:     [place]          [facility]  Referred By:      [facility] ---------------------------------------------------------------------- Orders  #  Description                           Code        Ordered By  1  US MFM OB FOLLOW UP                   [id number]    Lin LandsmanORENTHIAN                                                       BOOKER ----------------------------------------------------------------------  #  Order #                      Accession #                Episode #  1  [id number]                   [id number]                 [id number] ---------------------------------------------------------------------- Indications  Advanced maternal age multigravida 6635+,        67O09.522  second trimester (36)  Cervical incompetence, second trimester        O34.32  Prior poor obstetrical history antepartum,     O09.292  second trimester  Subchorionic hemorrhage, antepartum            O45.90  Poor obstetric history: Previous preterm       O09.219  delivery, antepartum (25 weeks twins)  [redacted] weeks gestation of pregnancy                Z3A.23  LR NIPS, Neg AFP  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 Cord Insertion:      Previously Visualized  Amniotic Fluid  AFI FV:      Within normal limits                              Largest Pocket(cm)                              5.9 ---------------------------------------------------------------------- Biometry  BPD:     57.35  mm     G. Age:  23w 4d         61  %    CI:        77.17   %    70 - 86  FL/HC:      20.3   %    19.2 - 20.8  HC:    206.71   mm     G. Age:  22w 5d         21  %    HC/AC:      1.05        1.05 - 1.21  AC:    197.06   mm     G. Age:  24w 3d         79  %    FL/BPD:     73.0   %    71 - 87  FL:      41.88  mm     G. Age:  23w 4d         55  %    FL/AC:      21.3   %    20 - 24  LV:        5.6  mm  Est. FW:     640  gm      1 lb 7 oz     79  % ---------------------------------------------------------------------- OB History  Gravidity:    6         Term:   1        Prem:   1        SAB:   2  TOP:           1        Living:  2 ---------------------------------------------------------------------- Gestational Age  LMP:           23w 1d        Date:  06/27/21                  EDD:   04/03/22  U/S Today:     23w 4d                                        EDD:   03/31/22  Best:          23w 1d     Det. By:  LMP  (06/27/21)          EDD:   04/03/22 ---------------------------------------------------------------------- Anatomy  Cranium:               Appears normal         Aortic Arch:            Appears normal  Cavum:                 Appears normal         Ductal Arch:            Appears normal  Ventricles:            Appears normal         Diaphragm:              Appears normal  Choroid Plexus:        Previously seen        Stomach:                Appears normal, left  sided  Cerebellum:            Previously seen        Abdomen:                Appears normal  Posterior Fossa:       Previously seen        Abdominal Wall:         Previously seen  Nuchal Fold:           Not applicable (>20    Cord Vessels:           Previously seen                         wks GA)  Face:                  Orbits and profile     Kidneys:                Appear normal                         previously seen  Lips:                  Previously seen        Bladder:                Appears normal  Thoracic:              Appears normal         Spine:                  Previously seen  Heart:                 Appears normal         Upper Extremities:      Previously seen                         (4CH, axis, and                         situs)  RVOT:                  Appears normal         Lower Extremities:      Previously seen  LVOT:                  Appears normal  Other:  Heels/feet and open hands/5th digits previously visualized. Nasal          bone, lenses and falx previously visualized. Female gender previously          seen. 3VV and 3VTV visualized.  ---------------------------------------------------------------------- Cervix Uterus Adnexa  Cervix  Length:            4.2  cm.  Normal appearance by transabdominal scan.  Uterus  No abnormality visualized.  Right Ovary  Within normal limits.  Left Ovary  Within normal limits.  Cul De Sac  No free fluid seen.  Adnexa  No abnormality visualized. ---------------------------------------------------------------------- Impression  Follow up growth due to clear anatomy and to assess cervical  length.  Normal interval growth with measurements consistent with  dates  Good fetal movement and amniotic fluid volume  The cervix is 4.2 cm.  She previously declined a cerclage although she had them in  two prior  pregnancies. ---------------------------------------------------------------------- Recommendations  CL in 2 weeks. ----------------------------------------------------------------------              Lin Landsman, MD Electronically Signed Final Report   12/06/2021 05:15 pm ----------------------------------------------------------------------   ASSESSMENT & PLAN Michelle Guzman 36 y.o. female with medical history significant for deficiency anemia in pregnancy who presents for a follow up visit.   After review of the labs, review of the records, and discussion with the patient the patients findings are most consistent with iron deficiency anemia of pregnancy.   At this time there is no clear evidence that the patient has a coagulopathy or vitamin K deficiency.  She has a normal INR, PT, and PTT.  Administration of vitamin K therapy could be considered at around the time of delivery if there was concerned about coagulopathy.  Consideration could be put into vitamin K 2.5 mg p.o. when the patient goes into labor in order to assure no issues with deficiency, though there is no clear evidence of a vitamin K deficiency this time.  Additionally vitamin K deficiency is typically only seen in infants and  individuals who have undergone prolonged antibiotic therapy.   #Iron deficiency anemia in the setting of pregnancy --Patient has findings concerning for a iron deficiency anemia of pregnancy. --Due to pronounced GI symptoms with p.o. iron therapy would recommend IV iron therapy to help bolster her levels --We will schedule the patient with a hospital for IV iron administration.  This did not occur at prior visit, we will schedule iron at this time. --Recommend assessing CBC prior to delivery. --Return to clinic in 3 months time in order to assure her levels remain elevated postdelivery.  #Concern for vitamin K deficiency --No clear evidence for vitamin K deficiency on today's labs including PT, INR, and PTT --At this time there is no firm reason to give vitamin K around the time of delivery, though one could consider administering vitamin K 2.5 mg p.o. within 24 hours prior to delivery if there was concern for coagulopathy --Oncology will continue to follow with the patient due to her iron deficiency  No orders of the defined types were placed in this encounter.  All questions were answered. The patient knows to call the clinic with any problems, questions or concerns.  A total of more than 30 minutes were spent on this encounter with face-to-face time and non-face-to-face time, including preparing to see the patient, ordering tests and/or medications, counseling the patient and coordination of care as outlined above.   Ulysees Barns, MD Department of Hematology/Oncology [facility] Phone: [phone] Pager: [phone] Email: [email or web address]  12/22/2021 1:36 PM

## 2021-12-13 NOTE — Telephone Encounter (Signed)
TCT patient regarding her iron infusion appt.  Spoke with and advised that her 1st iron infusion is scheduled [facility]17[facility]21 [facility]. Pt voiced understanding. Gave her instructions to location @ [facility].

## 2021-12-17 ENCOUNTER — Inpatient Hospital Stay (HOSPITAL_COMMUNITY): Admission: RE | Admit: 2021-12-17 | Payer: Medicaid Other | Source: Ambulatory Visit

## 2021-12-19 ENCOUNTER — Encounter (HOSPITAL_COMMUNITY)
Admission: RE | Admit: 2021-12-19 | Discharge: 2021-12-19 | Disposition: A | Discharge status: Home / Self Care | Payer: Medicaid Other | Source: Ambulatory Visit | Attending: Hematology and Oncology | Admitting: Hematology and Oncology

## 2021-12-19 DIAGNOSIS — D500 Iron deficiency anemia secondary to blood loss (chronic): Secondary | ICD-10-CM | POA: Insufficient documentation

## 2021-12-19 MED ORDER — SODIUM CHLORIDE 0.9 % IV SOLN
510.0000 mg | INTRAVENOUS | Status: DC
  Administered 2021-12-19: 510 mg via INTRAVENOUS
  Filled 2021-12-19: qty 510

## 2021-12-20 ENCOUNTER — Other Ambulatory Visit: Payer: Self-pay

## 2021-12-20 ENCOUNTER — Ambulatory Visit (INDEPENDENT_AMBULATORY_CARE_PROVIDER_SITE_OTHER): Payer: Medicaid Other | Admitting: *Deleted

## 2021-12-20 ENCOUNTER — Other Ambulatory Visit (HOSPITAL_COMMUNITY)
Admission: RE | Admit: 2021-12-20 | Discharge: 2021-12-20 | Disposition: A | Discharge status: Home / Self Care | Payer: Medicaid Other | Source: Ambulatory Visit | Attending: Family Medicine | Admitting: Family Medicine

## 2021-12-20 VITALS — BP 114/64 | HR 67 | Ht 60.0 in | Wt 132.6 lb

## 2021-12-20 DIAGNOSIS — O468X1 Other antepartum hemorrhage, first trimester: Secondary | ICD-10-CM

## 2021-12-20 DIAGNOSIS — O09529 Supervision of elderly multigravida, unspecified trimester: Secondary | ICD-10-CM

## 2021-12-20 DIAGNOSIS — O26899 Other specified pregnancy related conditions, unspecified trimester: Secondary | ICD-10-CM

## 2021-12-20 DIAGNOSIS — O0990 Supervision of high risk pregnancy, unspecified, unspecified trimester: Secondary | ICD-10-CM

## 2021-12-20 DIAGNOSIS — N898 Other specified noninflammatory disorders of vagina: Secondary | ICD-10-CM | POA: Insufficient documentation

## 2021-12-20 DIAGNOSIS — D684 Acquired coagulation factor deficiency: Secondary | ICD-10-CM

## 2021-12-20 DIAGNOSIS — IMO0001 Reserved for inherently not codable concepts without codable children: Secondary | ICD-10-CM

## 2021-12-20 NOTE — Progress Notes (Signed)
Here for self swab for c/o vaginal itching and vaginal discharge . Self swab obtained. Explained will be notified if + and needs treatment. Order placed under centering provider per protocol.  Nancy Fetter

## 2021-12-21 LAB — CERVICOVAGINAL ANCILLARY ONLY
Bacterial Vaginitis (gardnerella): NEGATIVE
Candida Glabrata: NEGATIVE
Candida Vaginitis: POSITIVE — AB
Chlamydia: NEGATIVE
Comment: NEGATIVE
Comment: NEGATIVE
Comment: NEGATIVE
Comment: NEGATIVE
Comment: NEGATIVE
Comment: NORMAL
Neisseria Gonorrhea: NEGATIVE
Trichomonas: NEGATIVE

## 2021-12-24 ENCOUNTER — Telehealth: Payer: Self-pay | Admitting: Hematology and Oncology

## 2021-12-24 ENCOUNTER — Encounter (HOSPITAL_COMMUNITY): Payer: Medicaid Other

## 2021-12-24 NOTE — Telephone Encounter (Signed)
Scheduled per 7/22 in basket, pt has been called and confirmed 

## 2021-12-25 ENCOUNTER — Other Ambulatory Visit: Payer: Self-pay | Admitting: Certified Nurse Midwife

## 2021-12-25 ENCOUNTER — Other Ambulatory Visit: Payer: Self-pay

## 2021-12-25 DIAGNOSIS — O0990 Supervision of high risk pregnancy, unspecified, unspecified trimester: Secondary | ICD-10-CM

## 2021-12-25 DIAGNOSIS — B3731 Acute candidiasis of vulva and vagina: Secondary | ICD-10-CM

## 2021-12-25 MED ORDER — TERCONAZOLE 0.4 % VA CREA: 1.0000 | TOPICAL_CREAM | Freq: Every day | VAGINAL | 0 refills | Status: DC

## 2021-12-26 ENCOUNTER — Encounter (HOSPITAL_COMMUNITY)
Admission: RE | Admit: 2021-12-26 | Discharge: 2021-12-26 | Disposition: A | Discharge status: Home / Self Care | Payer: Medicaid Other | Source: Ambulatory Visit | Attending: Hematology and Oncology | Admitting: Hematology and Oncology

## 2021-12-26 ENCOUNTER — Ambulatory Visit (INDEPENDENT_AMBULATORY_CARE_PROVIDER_SITE_OTHER): Payer: Medicaid Other | Admitting: Certified Nurse Midwife

## 2021-12-26 ENCOUNTER — Other Ambulatory Visit: Payer: Medicaid Other

## 2021-12-26 ENCOUNTER — Other Ambulatory Visit: Payer: Self-pay

## 2021-12-26 VITALS — BP 120/54 | HR 75 | Wt 135.8 lb

## 2021-12-26 DIAGNOSIS — D500 Iron deficiency anemia secondary to blood loss (chronic): Secondary | ICD-10-CM | POA: Diagnosis not present

## 2021-12-26 DIAGNOSIS — Z3A26 26 weeks gestation of pregnancy: Secondary | ICD-10-CM

## 2021-12-26 DIAGNOSIS — Z8751 Personal history of pre-term labor: Secondary | ICD-10-CM

## 2021-12-26 DIAGNOSIS — O0990 Supervision of high risk pregnancy, unspecified, unspecified trimester: Secondary | ICD-10-CM

## 2021-12-26 DIAGNOSIS — O09522 Supervision of elderly multigravida, second trimester: Secondary | ICD-10-CM

## 2021-12-26 MED ORDER — SODIUM CHLORIDE 0.9 % IV SOLN
510.0000 mg | Freq: Once | INTRAVENOUS | Status: AC
  Administered 2021-12-26: 510 mg via INTRAVENOUS
  Filled 2021-12-26: qty 510

## 2021-12-27 LAB — CBC
Hematocrit: 28.5 % — ABNORMAL LOW (ref 34.0–46.6)
Hemoglobin: 9.2 g/dL — ABNORMAL LOW (ref 11.1–15.9)
MCH: 28.9 pg (ref 26.6–33.0)
MCHC: 32.3 g/dL (ref 31.5–35.7)
MCV: 90 fL (ref 79–97)
Platelets: 218 10*3/uL (ref 150–450)
RBC: 3.18 x10E6/uL — ABNORMAL LOW (ref 3.77–5.28)
RDW: 12.5 % (ref 11.7–15.4)
WBC: 6.8 10*3/uL (ref 3.4–10.8)

## 2021-12-27 LAB — GLUCOSE TOLERANCE, 2 HOURS W/ 1HR
Glucose, 1 hour: 127 mg/dL (ref 70–179)
Glucose, 2 hour: 99 mg/dL (ref 70–152)
Glucose, Fasting: 82 mg/dL (ref 70–91)

## 2021-12-27 LAB — HIV ANTIBODY (ROUTINE TESTING W REFLEX): HIV Screen 4th Generation wRfx: NONREACTIVE

## 2021-12-27 LAB — RPR: RPR Ser Ql: NONREACTIVE

## 2021-12-27 NOTE — Progress Notes (Signed)
   PRENATAL VISIT NOTE  Subjective:  Michelle Guzman is a 36 y.o. [id number] at [redacted]w[redacted]d being seen today for ongoing prenatal care.  She is currently monitored for the following issues for this high-risk pregnancy and has Supervision of high risk pregnancy, antepartum; AMA (advanced maternal age) multigravida 35+; Subchorionic hemorrhage in first trimester; History of preterm delivery; Dietary lactose intolerance; and Vitamin K deficiency coagulation disorder (HCC) on their problem list.  Patient reports no complaints.  Contractions: Not present. Vag. Bleeding: None.  Movement: Present. Denies leaking of fluid.   The following portions of the patient's history were reviewed and updated as appropriate: allergies, current medications, past family history, past medical history, past social history, past surgical history and problem list.   Objective:   Vitals:   12/26/21 0927  BP: (!) 120/54  Pulse: 75  Weight: 135 lb 12.8 oz (61.6 kg)    Fetal Status: Fetal Heart Rate (bpm): 142 Fundal Height: 27 cm Movement: Present     General:  Alert, oriented and cooperative. Patient is in no acute distress.  Skin: Skin is warm and dry. No rash noted.   Cardiovascular: Normal heart rate noted  Respiratory: Normal respiratory effort, no problems with respiration noted  Abdomen: Soft, gravid, appropriate for gestational age.  Pain/Pressure: Absent     Pelvic: Cervical exam deferred        Extremities: Normal range of motion.  Edema: None  Mental Status: Normal mood and affect. Normal behavior. Normal judgment and thought content.   Assessment and Plan:  Pregnancy: [id number] at [redacted]w[redacted]d 1. Supervision of high risk pregnancy, antepartum - Doing well, feeling regular and vigorous fetal movement   2. [redacted] weeks gestation of pregnancy - Routine OB care including GTT and 3rd trimester labs today (very nauseated from glucola)  3. Multigravida of advanced maternal age in second trimester - Taking  daily low dose aspirin  4. History of preterm delivery - No s/sx of PTL   Centering Pregnancy, Session#5: Reviewed resources in [employer].   Facilitated discussion today:  Stages of Labor, Sign of labor, coping strategies and deep relaxation breathing.  Patient to continue group prenatal care.  Preterm labor symptoms and general obstetric precautions including but not limited to vaginal bleeding, contractions, leaking of fluid and fetal movement were reviewed in detail with the patient. Please refer to After Visit Summary for other counseling recommendations.   Return in about 2 weeks (around 01/09/2022) for IN-PERSON, CENTERING.  Future Appointments  Date Time Provider Department Center  01/09/2022  9:00 AM CENTERING PROVIDER [facility]  01/23/2022  9:00 AM CENTERING PROVIDER [facility]  02/06/2022  9:00 AM CENTERING PROVIDER [facility]  02/08/2022  1:20 PM Tobb, Kardie, DO [facility] None  02/20/2022  9:00 AM CENTERING PROVIDER [facility]  03/06/2022  9:00 AM CENTERING PROVIDER [facility] [place]  03/25/2022 10:45 AM [facility] None  03/25/2022 11:20 AM Leonides Schanz, Thereasa Distance, MD [facility] None    Bernerd Limbo, CNM

## 2022-01-08 ENCOUNTER — Encounter: Payer: Self-pay | Admitting: Certified Nurse Midwife

## 2022-01-09 ENCOUNTER — Ambulatory Visit: Payer: Medicaid Other | Admitting: Certified Nurse Midwife

## 2022-01-09 ENCOUNTER — Telehealth: Payer: Self-pay

## 2022-01-09 DIAGNOSIS — Z8751 Personal history of pre-term labor: Secondary | ICD-10-CM

## 2022-01-09 DIAGNOSIS — O09523 Supervision of elderly multigravida, third trimester: Secondary | ICD-10-CM

## 2022-01-09 DIAGNOSIS — O0993 Supervision of high risk pregnancy, unspecified, third trimester: Secondary | ICD-10-CM

## 2022-01-09 DIAGNOSIS — Z3A28 28 weeks gestation of pregnancy: Secondary | ICD-10-CM

## 2022-01-09 NOTE — Progress Notes (Signed)
Patient did not attend Centering appointment today.

## 2022-01-09 NOTE — Telephone Encounter (Signed)
Called Pt to see why she did not show for Centering this morning, no answer, was able to leave message regarding next appt on 01/23/22.

## 2022-01-17 ENCOUNTER — Encounter (HOSPITAL_COMMUNITY): Payer: Self-pay | Admitting: Obstetrics and Gynecology

## 2022-01-17 ENCOUNTER — Inpatient Hospital Stay (HOSPITAL_COMMUNITY)
Admission: AD | Admit: 2022-01-17 | Discharge: 2022-01-18 | Disposition: A | Discharge status: Home / Self Care | Payer: Medicaid Other | Attending: Obstetrics and Gynecology | Admitting: Obstetrics and Gynecology

## 2022-01-17 ENCOUNTER — Encounter: Payer: Self-pay | Admitting: Certified Nurse Midwife

## 2022-01-17 DIAGNOSIS — M545 Low back pain, unspecified: Secondary | ICD-10-CM | POA: Diagnosis not present

## 2022-01-17 DIAGNOSIS — R519 Headache, unspecified: Secondary | ICD-10-CM | POA: Insufficient documentation

## 2022-01-17 DIAGNOSIS — O99323 Drug use complicating pregnancy, third trimester: Secondary | ICD-10-CM | POA: Diagnosis not present

## 2022-01-17 DIAGNOSIS — O26893 Other specified pregnancy related conditions, third trimester: Secondary | ICD-10-CM | POA: Diagnosis not present

## 2022-01-17 DIAGNOSIS — Z8751 Personal history of pre-term labor: Secondary | ICD-10-CM

## 2022-01-17 DIAGNOSIS — O212 Late vomiting of pregnancy: Secondary | ICD-10-CM | POA: Insufficient documentation

## 2022-01-17 DIAGNOSIS — Z3A29 29 weeks gestation of pregnancy: Secondary | ICD-10-CM | POA: Insufficient documentation

## 2022-01-17 DIAGNOSIS — O09213 Supervision of pregnancy with history of pre-term labor, third trimester: Secondary | ICD-10-CM | POA: Insufficient documentation

## 2022-01-17 DIAGNOSIS — O09523 Supervision of elderly multigravida, third trimester: Secondary | ICD-10-CM | POA: Diagnosis not present

## 2022-01-17 DIAGNOSIS — D684 Acquired coagulation factor deficiency: Secondary | ICD-10-CM

## 2022-01-17 DIAGNOSIS — O4700 False labor before 37 completed weeks of gestation, unspecified trimester: Secondary | ICD-10-CM

## 2022-01-17 DIAGNOSIS — O368130 Decreased fetal movements, third trimester, not applicable or unspecified: Secondary | ICD-10-CM | POA: Diagnosis present

## 2022-01-17 DIAGNOSIS — O0990 Supervision of high risk pregnancy, unspecified, unspecified trimester: Secondary | ICD-10-CM

## 2022-01-17 LAB — URINALYSIS, ROUTINE W REFLEX MICROSCOPIC
Bilirubin Urine: NEGATIVE
Glucose, UA: NEGATIVE mg/dL
Hgb urine dipstick: NEGATIVE
Ketones, ur: 80 mg/dL — AB
Leukocytes,Ua: NEGATIVE
Nitrite: NEGATIVE
Protein, ur: NEGATIVE mg/dL
Specific Gravity, Urine: 1.004 — ABNORMAL LOW (ref 1.005–1.030)
pH: 7 (ref 5.0–8.0)

## 2022-01-17 LAB — FETAL FIBRONECTIN: Fetal Fibronectin: NEGATIVE

## 2022-01-17 MED ORDER — NIFEDIPINE 10 MG PO CAPS
10.0000 mg | ORAL_CAPSULE | ORAL | Status: AC | PRN
  Administered 2022-01-17 (×4): 10 mg via ORAL
  Filled 2022-01-17 (×4): qty 1

## 2022-01-17 MED ORDER — TERBUTALINE SULFATE 1 MG/ML IJ SOLN
0.2500 mg | Freq: Once | INTRAMUSCULAR | Status: AC
  Administered 2022-01-17: 0.25 mg via SUBCUTANEOUS
  Filled 2022-01-17: qty 1

## 2022-01-17 NOTE — MAU Note (Addendum)
.  Michelle Guzman is a 36 y.o. at [redacted]w[redacted]d here in MAU reporting ctxs since 1730. Some lower back pain that is dull. Has felt baby move today but not like usual. Ate bkf and lunch but vomited about 1645 so has not eaten dinner. Did hydrate well once ctxs started to see if it would help. Denies VB or LOF. Some pelvic pressure  Onset of complaint: 1730 Pain score: 7 Vitals:   01/17/22 2034 01/17/22 2037  BP:  122/66  Pulse: 74   Resp: 17   Temp: 98 F (36.7 C)   SpO2: 100%      FHT:145 Lab orders placed from triage:  u/a

## 2022-01-17 NOTE — MAU Provider Note (Signed)
Chief Complaint:  Contractions, Nausea, and Decreased Fetal Movement   Event Date/Time   First Provider Initiated Contact with Patient 01/17/22 2050     HPI: Michelle Guzman is a 36 y.o. [id number] at 12w1dwho presents to maternity admissions reporting preterm uterine contractions since 1730.  Has a history of preterm birth with her second child.  No preterm labor with first pregnancy. . She reports good fetal movement, denies LOF, vaginal bleeding, vaginal itching/burning, urinary symptoms, h/a, dizziness, n/v, diarrhea, constipation or fever/chills.    Abdominal Pain This is a new problem. The current episode started today. The problem occurs intermittently. The pain is mild. The quality of the pain is cramping. The abdominal pain does not radiate. Pertinent negatives include no constipation, diarrhea, dysuria, fever, frequency or myalgias. Nothing aggravates the pain. The pain is relieved by Nothing. She has tried nothing for the symptoms.   RN Note: Michelle Guzman is a 36 y.o. at [redacted]w[redacted]d here in MAU reporting ctxs since 1730. Some lower back pain that is dull. Has felt baby move today but not like usual. Ate bkf and lunch but vomited about 1645 so has not eaten dinner. Did hydrate well once ctxs started to see if it would help. Denies VB or LOF. Some pelvic pressure  Onset of complaint: 1730           Pain score: 7  Past Medical History: Past Medical History:  Diagnosis Date   Medical history non-contributory    Subchorionic hemorrhage in first trimester 08/30/2021   Vitamin K deficiency coagulation disorder (HCC)     Past obstetric history: OB History  Gravida Para Term Preterm AB Living  6 2 1 1 3 2   SAB IAB Ectopic Multiple Live Births  2 1     2     # Outcome Date GA Lbr Len/2nd Weight Sex Delivery Anes PTL Lv  6 Current           5 SAB 2021          4 Preterm 01/06/09 [redacted]w[redacted]d   F Vag-Spont   LIV  3 Term 01/18/07 [redacted]w[redacted]d   F Vag-Spont   LIV  2 SAB   [redacted]w[redacted]d         1 IAB             Past Surgical History: Past Surgical History:  Procedure Laterality Date   DILATION AND CURETTAGE OF UTERUS N/A 09/24/2019   Procedure: SUCTION DILATATION AND CURETTAGE;  Surgeon: [redacted]w[redacted]d, MD;  Location: [facility];  Service: Gynecology;  Laterality: N/A;   NO PAST SURGERIES      Family History: Family History  Problem Relation Age of Onset   Lupus Mother    Drug abuse Mother    Hypertension Mother    Mental illness Mother    Drug abuse Father    Heart disease Father     Social History: Social History   Tobacco Use   Smoking status: Never   Smokeless tobacco: Never  Vaping Use   Vaping Use: Never used  Substance Use Topics   Alcohol use: Not Currently   Drug use: Not Currently    Types: Marijuana    Comment: stopped a couple weeks ago per pt on 09/23/19    Allergies:  Allergies  Allergen Reactions   Lactose Intolerance (Gi) Nausea And Vomiting and Other (See Comments)    headaches   Orange Juice [Orange Oil] Swelling    Meds:  Medications Prior to  Admission  Medication Sig Dispense Refill Last Dose   Blood Pressure Monitoring DEVI 1 each by Does not apply route once a week. 1 each 0    Prenatal Vit-Fe Fumarate-FA (PRENATAL PO) Take by mouth.      terconazole (TERAZOL 7) 0.4 % vaginal cream Place 1 applicator vaginally at bedtime. 45 g 0     I have reviewed patient's Past Medical Hx, Surgical Hx, Family Hx, Social Hx, medications and allergies.   ROS:  Review of Systems  Constitutional:  Negative for fever.  Gastrointestinal:  Positive for abdominal pain. Negative for constipation and diarrhea.  Genitourinary:  Negative for dysuria and frequency.  Musculoskeletal:  Negative for myalgias.   Other systems negative  Physical Exam  Patient Vitals for the past 24 hrs:  BP Temp Pulse Resp SpO2 Height Weight  01/17/22 2037 122/66 -- -- -- -- -- --  01/17/22 2034 -- 98 F (36.7 C) 74 17 100 % 5\' 1"  (1.549 m) 60.8 kg    Constitutional: Well-developed, well-nourished female in no acute distress.  Cardiovascular: normal rate and rhythm Respiratory: normal effort GI: Abd soft, non-tender, gravid appropriate for gestational age.   No rebound or guarding. MS: Extremities nontender, no edema, normal ROM Neurologic: Alert and oriented x 4.  GU: Neg CVAT.  PELVIC EXAM: Fetal Fibronectin sent  Dilation: 1 Effacement (%): Thick Station: Ballotable Exam by:: [email or web address], CNM  FHT:  Baseline 140s , moderate variability, accelerations present, no decelerations Contractions: q 2-4 mins Irregular     Labs:  B/Positive/-- (04/13 1059)  Imaging:  No results found.  MAU Course/MDM: I have reviewed the triage vital signs and the nursing notes.   Pertinent labs & imaging results that were available during my care of the patient were reviewed by me and considered in my medical decision making (see chart for details).      I have reviewed her medical records including past results, notes and treatments.   I have ordered labs and reviewed results.  UA is clear with ketones.  FFn is Negative NST reviewed, reactive Consult Dr 09-10-1982 with presentation, exam findings and test results.  States if we can slow UCs with Terb, may discharge home with Procardia XL   Treatments in MAU included EFM, FFn, Procardia Series.   2215:  First dose of Procardia given, UCs more frequent 2238   Second dose 2258   Third dose.  UCs spaced out and shorter, still every 3-33min 2322    Fourth dose.  Still contracting frequently but less painful   Cervix unchanged Consulted Dr 2323 who recommends Terbutaline 2354   Terbutaline given, UCs slowed and shortened.  Pt states they do not hurt anymore 0025    Has headache, will give Tylenol >> good relief  Assessment: Single IUP at [redacted]w[redacted]d Preterm uterine contractions Cervix dilated, ?etiology, Multiparous vs PTL Headache in pregnancy  Plan: Discharge home Rx Procardia XL daily  prn PTL  Preterm Labor precautions and fetal kick counts Follow up in Office for prenatal visits and recheck Encouraged to return if she develops worsening of symptoms, increase in pain, fever, or other concerning symptoms.   Pt stable at time of discharge.  [redacted]w[redacted]d CNM, MSN Certified Nurse-Midwife 01/17/2022 8:50 PM

## 2022-01-18 MED ORDER — NIFEDIPINE ER OSMOTIC RELEASE 30 MG PO TB24: 30.0000 mg | ORAL_TABLET | Freq: Every day | ORAL | 0 refills | Status: DC

## 2022-01-18 MED ORDER — ACETAMINOPHEN 500 MG PO TABS
1000.0000 mg | ORAL_TABLET | Freq: Once | ORAL | Status: AC
Start: 2022-01-18 — End: 2022-01-18
  Administered 2022-01-18: 1000 mg via ORAL
  Filled 2022-01-18: qty 2

## 2022-01-18 MED ORDER — NIFEDIPINE ER OSMOTIC RELEASE 30 MG PO TB24: 30.0000 mg | ORAL_TABLET | Freq: Every day | ORAL | 0 refills | Status: DC | PRN

## 2022-01-23 ENCOUNTER — Ambulatory Visit (INDEPENDENT_AMBULATORY_CARE_PROVIDER_SITE_OTHER): Payer: Medicaid Other | Admitting: Certified Nurse Midwife

## 2022-01-23 VITALS — BP 95/76 | HR 87 | Wt 134.4 lb

## 2022-01-23 DIAGNOSIS — Z8751 Personal history of pre-term labor: Secondary | ICD-10-CM

## 2022-01-23 DIAGNOSIS — O09523 Supervision of elderly multigravida, third trimester: Secondary | ICD-10-CM

## 2022-01-23 DIAGNOSIS — Z3A30 30 weeks gestation of pregnancy: Secondary | ICD-10-CM

## 2022-01-23 DIAGNOSIS — O0993 Supervision of high risk pregnancy, unspecified, third trimester: Secondary | ICD-10-CM

## 2022-01-25 NOTE — Progress Notes (Signed)
   PRENATAL VISIT NOTE  Subjective:  Michelle Guzman is a 36 y.o. [phone] at [redacted]w[redacted]d being seen today for ongoing prenatal care.  She is currently monitored for the following issues for this high-risk pregnancy and has Supervision of high risk pregnancy, antepartum; AMA (advanced maternal age) multigravida 35+; History of preterm delivery; Dietary lactose intolerance; and Vitamin K deficiency coagulation disorder (HCC) on their problem list.  Patient reports no complaints.  Contractions: Irritability. Vag. Bleeding: None.  Movement: Present. Denies leaking of fluid.   The following portions of the patient's history were reviewed and updated as appropriate: allergies, current medications, past family history, past medical history, past social history, past surgical history and problem list.   Objective:   Vitals:   01/23/22 0922  BP: 95/76  Pulse: 87  Weight: 134 lb 6.4 oz (61 kg)    Fetal Status: Fetal Heart Rate (bpm): 140 Fundal Height: 30 cm Movement: Present     General:  Alert, oriented and cooperative. Patient is in no acute distress.  Skin: Skin is warm and dry. No rash noted.   Cardiovascular: Normal heart rate noted  Respiratory: Normal respiratory effort, no problems with respiration noted  Abdomen: Soft, gravid, appropriate for gestational age.  Pain/Pressure: Present     Pelvic: Cervical exam deferred        Extremities: Normal range of motion.  Edema: None  Mental Status: Normal mood and affect. Normal behavior. Normal judgment and thought content.   Assessment and Plan:  Pregnancy: [id number] at [redacted]w[redacted]d 1. Supervision of high risk pregnancy in third trimester - Doing well, feeling regular and vigorous fetal movement   2. [redacted] weeks gestation of pregnancy - Routine OB care   3. Multigravida of advanced maternal age in third trimester - Taking low dose aspirin  4. History of preterm delivery - Went to MAU for preterm delivery, feeling better today -  Strong preterm labor precautions  Preterm labor symptoms and general obstetric precautions including but not limited to vaginal bleeding, contractions, leaking of fluid and fetal movement were reviewed in detail with the patient. Please refer to After Visit Summary for other counseling recommendations.   Return in about 2 weeks (around 02/06/2022) for IN-PERSON, CENTERING.  Future Appointments  Date Time Provider Department Center  02/06/2022  9:00 AM CENTERING PROVIDER [facility]  02/08/2022  1:20 PM Tobb, Kardie, DO [facility] None  02/20/2022  9:00 AM CENTERING PROVIDER [facility]  03/06/2022  9:00 AM CENTERING PROVIDER [facility]  03/13/2022  3:35 PM Bernerd Limbo, CNM [facility]  03/20/2022  3:35 PM Bernerd Limbo, CNM [facility]  03/25/2022 10:45 AM [facility] None  03/25/2022 11:20 AM Jaci Standard, MD [facility] None  03/27/2022  3:15 PM Bernerd Limbo, CNM [facility]    Bernerd Limbo, CNM

## 2022-01-29 ENCOUNTER — Telehealth: Payer: Self-pay | Admitting: *Deleted

## 2022-01-29 NOTE — Telephone Encounter (Signed)
Sent staff message to Dr. Servando Salina letting her know that this pt's live zio has been marked as lost. She received message and thanked me.

## 2022-02-06 ENCOUNTER — Ambulatory Visit (INDEPENDENT_AMBULATORY_CARE_PROVIDER_SITE_OTHER): Payer: Medicaid Other | Admitting: Certified Nurse Midwife

## 2022-02-06 VITALS — BP 101/60 | HR 87 | Wt 138.6 lb

## 2022-02-06 DIAGNOSIS — Z8751 Personal history of pre-term labor: Secondary | ICD-10-CM

## 2022-02-06 DIAGNOSIS — Z3A32 32 weeks gestation of pregnancy: Secondary | ICD-10-CM

## 2022-02-06 DIAGNOSIS — O09523 Supervision of elderly multigravida, third trimester: Secondary | ICD-10-CM

## 2022-02-06 DIAGNOSIS — O0993 Supervision of high risk pregnancy, unspecified, third trimester: Secondary | ICD-10-CM

## 2022-02-06 NOTE — Progress Notes (Signed)
   PRENATAL VISIT NOTE  Subjective:  Michelle Guzman is a 36 y.o. [id number] at [redacted]w[redacted]d being seen today for ongoing prenatal care.  She is currently monitored for the following issues for this high-risk pregnancy and has Supervision of high risk pregnancy, antepartum; AMA (advanced maternal age) multigravida 35+; History of preterm delivery; Dietary lactose intolerance; and Vitamin K deficiency coagulation disorder (HCC) on their problem list.  Patient reports no complaints.  Contractions: Not present. Vag. Bleeding: None.  Movement: Present. Denies leaking of fluid.   The following portions of the patient's history were reviewed and updated as appropriate: allergies, current medications, past family history, past medical history, past social history, past surgical history and problem list.   Objective:   Vitals:   02/06/22 0856  BP: 101/60  Pulse: 87  Weight: 138 lb 9.6 oz (62.9 kg)    Fetal Status: Fetal Heart Rate (bpm): 140 Fundal Height: 32 cm Movement: Present     General:  Alert, oriented and cooperative. Patient is in no acute distress.  Skin: Skin is warm and dry. No rash noted.   Cardiovascular: Normal heart rate noted  Respiratory: Normal respiratory effort, no problems with respiration noted  Abdomen: Soft, gravid, appropriate for gestational age.  Pain/Pressure: Absent     Pelvic: Cervical exam deferred        Extremities: Normal range of motion.  Edema: None  Mental Status: Normal mood and affect. Normal behavior. Normal judgment and thought content.   Assessment and Plan:  Pregnancy: [id number] at [redacted]w[redacted]d 1. Supervision of high risk pregnancy in third trimester - Doing well, feeling regular and vigorous fetal movement   2. [redacted] weeks gestation of pregnancy - Routine OB care   3. Multigravida of advanced maternal age in third trimester  - Taking aspirin  4. History of preterm delivery - Notes more contractions if stressed or dehydrated, able to manage them  well - Seen in MAU once for PTL and was found to be 1cm, last CL=4.2  Preterm labor symptoms and general obstetric precautions including but not limited to vaginal bleeding, contractions, leaking of fluid and fetal movement were reviewed in detail with the patient. Please refer to After Visit Summary for other counseling recommendations.   Return in about 2 weeks (around 02/20/2022) for IN-PERSON, CENTERING.  Future Appointments  Date Time Provider Department Center  02/08/2022  1:20 PM Tobb, Kardie, DO [facility] None  02/20/2022  9:00 AM CENTERING PROVIDER [facility]  03/06/2022  9:00 AM CENTERING PROVIDER [facility]  03/13/2022  3:35 PM Bernerd Limbo, CNM [facility]  03/20/2022  3:35 PM Bernerd Limbo, CNM [facility]  03/25/2022 10:45 AM [facility] None  03/25/2022 11:20 AM Jaci Standard, MD [facility] None  03/27/2022  3:15 PM Bernerd Limbo, CNM [facility]    Bernerd Limbo, CNM

## 2022-02-08 ENCOUNTER — Ambulatory Visit: Payer: Medicaid Other | Admitting: Cardiology

## 2022-02-20 ENCOUNTER — Ambulatory Visit: Payer: Medicaid Other | Admitting: Certified Nurse Midwife

## 2022-02-20 NOTE — Progress Notes (Signed)
Had a prior obligation, could not attend Centering today.

## 2022-03-06 ENCOUNTER — Ambulatory Visit (INDEPENDENT_AMBULATORY_CARE_PROVIDER_SITE_OTHER): Payer: Medicaid Other | Admitting: Certified Nurse Midwife

## 2022-03-06 ENCOUNTER — Other Ambulatory Visit (HOSPITAL_COMMUNITY)
Admission: RE | Admit: 2022-03-06 | Discharge: 2022-03-06 | Disposition: A | Discharge status: Home / Self Care | Payer: Medicaid Other | Source: Ambulatory Visit | Attending: Certified Nurse Midwife | Admitting: Certified Nurse Midwife

## 2022-03-06 VITALS — BP 110/61 | HR 68

## 2022-03-06 DIAGNOSIS — Z3A36 36 weeks gestation of pregnancy: Secondary | ICD-10-CM

## 2022-03-06 DIAGNOSIS — O0993 Supervision of high risk pregnancy, unspecified, third trimester: Secondary | ICD-10-CM

## 2022-03-06 DIAGNOSIS — Z8751 Personal history of pre-term labor: Secondary | ICD-10-CM

## 2022-03-06 NOTE — Progress Notes (Signed)
   PRENATAL VISIT NOTE  Subjective:  Michelle Guzman is a 36 y.o. [phone] at [redacted]w[redacted]d being seen today for ongoing prenatal care.  She is currently monitored for the following issues for this high-risk pregnancy and has Supervision of high risk pregnancy, antepartum; AMA (advanced maternal age) multigravida 21+; History of preterm delivery; Dietary lactose intolerance; and Vitamin K deficiency coagulation disorder ([place]) on their problem list.  Patient reports no complaints.  Contractions: Not present. Vag. Bleeding: None.  Movement: Present. Denies leaking of fluid.   The following portions of the patient's history were reviewed and updated as appropriate: allergies, current medications, past family history, past medical history, past social history, past surgical history and problem list.   Objective:   Vitals:   03/06/22 0904  BP: 110/61  Pulse: 68   Fetal Status: Fetal Heart Rate (bpm): 142 Fundal Height: 35 cm Movement: Present     General:  Alert, oriented and cooperative. Patient is in no acute distress.  Skin: Skin is warm and dry. No rash noted.   Cardiovascular: Normal heart rate noted  Respiratory: Normal respiratory effort, no problems with respiration noted  Abdomen: Soft, gravid, appropriate for gestational age.  Pain/Pressure: Absent     Pelvic: Cervical exam deferred        Extremities: Normal range of motion.  Edema: Trace  Mental Status: Normal mood and affect. Normal behavior. Normal judgment and thought content.   Assessment and Plan:  Pregnancy: [id number] at [redacted]w[redacted]d 1. Supervision of high risk pregnancy in third trimester - Doing well, feeling regular and vigorous fetal movement   2. [redacted] weeks gestation of pregnancy - Routine OB care  - GC/Chlamydia probe amp ([place])not at [facility] - Culture, beta strep (group b only)  3. History of preterm delivery - No s/sx of labor other than normal BH   Centering Pregnancy, Session#10: Reviewed resources in  [employer].  Facilitated discussion today: latching, normal newborn weight loss/jaundice, newborn behavioral cues and calming, postpartum planning Patient to transition to traditional care until postpartum reunion visit.  Preterm labor symptoms and general obstetric precautions including but not limited to vaginal bleeding, contractions, leaking of fluid and fetal movement were reviewed in detail with the patient. Please refer to After Visit Summary for other counseling recommendations.   Return in about 1 week (around 03/13/2022) for IN-PERSON, LOB.  Future Appointments  Date Time Provider [place]  03/08/2022  2:40 PM Berniece Salines, DO [facility] None  03/13/2022  9:15 AM Gabriel Carina, CNM [facility]  03/20/2022  3:35 PM Gabriel Carina, CNM [facility]  03/25/2022 10:45 AM [facility] None  03/25/2022 11:20 AM Orson Slick, MD [facility] None  03/27/2022  3:15 PM Gabriel Carina, CNM [facility]   Gabriel Carina, CNM

## 2022-03-06 NOTE — Patient Instructions (Signed)
Midwife Jam's Lactation Bars Preheat oven to 350 degrees and grease a 9x13 pan Combine your fats and sugars: - 1c salted butter - 1/2-3/4c peanut butter - 2 eggs - 2tbsp flaxmeal in 4tbsp water - 1tsp vanilla - 1c white sugar - 1c brown sugar Mix the following dry ingredients in a separate bowl: - 2c flour - 1tsp baking soda - 1tsp salt Slowly add the dry into the wet mix. Once well combined, stir in the following: - 3c rolled oats  - 1c (or more) chocolate chips  Bake for 45min.  Can sub almond butter and add 1tsp cinnamon and raisins or dried cranberries.   Postpartum Herbal Tea 1/2c calendula 1/4c lavender 1/2c chamomile 1/4c lemon balm 1/2c rose petals 1/4c comfrey  1/2c epsom salt [place] bag or cheesecloth tied off  Put all herbs in the bag/cheesecloth and let soak in a pot of hot water for at least 20min.  Unwrap regular pads but do not take off the wrapper.  Spoon tea on to regular pads until they are soaked and refold the pads so they can go in the fridge.  Use frozen pads on your perineum for as long as desired.  Run a bath (no warmer than 102 degrees) and add the leftover tea and epsom salt. Have a nice bath with your baby, the herbs are very antibacterial so it's ok if their belly button gets wet just try not to submerge for long.  

## 2022-03-07 LAB — GC/CHLAMYDIA PROBE AMP (~~LOC~~) NOT AT ARMC
Chlamydia: NEGATIVE
Comment: NEGATIVE
Comment: NORMAL
Neisseria Gonorrhea: NEGATIVE

## 2022-03-08 ENCOUNTER — Ambulatory Visit (INDEPENDENT_AMBULATORY_CARE_PROVIDER_SITE_OTHER): Payer: Medicaid Other | Admitting: Cardiology

## 2022-03-08 ENCOUNTER — Encounter: Payer: Self-pay | Admitting: Cardiology

## 2022-03-08 VITALS — BP 118/68 | HR 77 | Ht 60.0 in | Wt 140.0 lb

## 2022-03-08 DIAGNOSIS — R002 Palpitations: Secondary | ICD-10-CM

## 2022-03-08 NOTE — Progress Notes (Addendum)
Cardio-Obstetrics Clinic  Follow Up Note   Date:  03/28/2022   ID:  Michelle Guzman, DOB [birth year], MRN [id number]  PCP:  Patient, No Pcp Per   [facility] Providers Cardiologist:  Thomasene Ripple, DO  Electrophysiologist:  None        Referring MD: No ref. provider found   Chief Complaint: " I a   History of Present Illness:    Michelle Guzman is a 36 y.o. female [[id number]] who returns for follow up of of palpitations.   Since I saw the patient she has been taking off the Procardia which was given to her for contractions.  She offers no complaints at this time.  Prior CV Studies Reviewed: The following studies were reviewed today:   Past Medical History:  Diagnosis Date   Blood transfusion without reported diagnosis    Medical history non-contributory    Subchorionic hemorrhage in first trimester 08/30/2021   Vitamin K deficiency coagulation disorder [facility])     Past Surgical History:  Procedure Laterality Date   DILATION AND CURETTAGE OF UTERUS N/A 09/24/2019   Procedure: SUCTION DILATATION AND CURETTAGE;  Surgeon: Reva Bores, MD;  Location: [facility] OR;  Service: Gynecology;  Laterality: N/A;   NO PAST SURGERIES        OB History     Gravida  6   Para  3   Term  2   Preterm  1   AB  3   Living  3      SAB  2   IAB  1   Ectopic      Multiple  0   Live Births  3               Current Medications: Current Meds  Medication Sig   Prenatal Vit-Fe Fumarate-FA (PRENATAL PO) Take 2 tablets by mouth daily.   [DISCONTINUED] Blood Pressure Monitoring DEVI 1 each by Does not apply route once a week.     Allergies:   Lactose intolerance (gi) and Orange juice [orange oil]   Social History   Socioeconomic History   Marital status: Single    Spouse name: Not on file   Number of children: Not on file   Years of education: Not on file   Highest education level: Not on file  Occupational History   Not on  file  Tobacco Use   Smoking status: Never   Smokeless tobacco: Never  Vaping Use   Vaping Use: Never used  Substance and Sexual Activity   Alcohol use: Not Currently   Drug use: Not Currently    Types: Marijuana    Comment: stopped a couple weeks ago   Sexual activity: Yes    Birth control/protection: None  Other Topics Concern   Not on file  Social History Narrative   Not on file   Social Determinants of Health   Financial Resource Strain: Not on file  Food Insecurity: No Food Insecurity (03/13/2022)   Hunger Vital Sign    Worried About Running Out of Food in the Last Year: Never true    Ran Out of Food in the Last Year: Never true  Transportation Needs: No Transportation Needs (03/13/2022)   PRAPARE - Administrator, Civil Service (Medical): No    Lack of Transportation (Non-Medical): No  Physical Activity: Not on file  Stress: Not on file  Social Connections: Not on file      Family History  Problem Relation Age of  Onset   Lupus Mother    Drug abuse Mother    Hypertension Mother    Mental illness Mother    Drug abuse Father    Heart disease Father       ROS:   Please see the history of present illness.     All other systems reviewed and are negative.   Labs/EKG Reviewed:    EKG:   EKG is was ordered today.  The ekg ordered today demonstrates sinus rhythm heart rate 71bpm   Recent Labs: 12/13/2021: ALT 8; BUN 6; Creatinine 0.55; Potassium 3.7; Sodium 135 03/24/2022: Hemoglobin 10.1; Platelets 210   Recent Lipid Panel No results found for: "CHOL", "TRIG", "HDL", "CHOLHDL", "LDLCALC", "LDLDIRECT"  Physical Exam:    VS:  BP 118/68   Pulse 77   Ht 5' (1.524 m)   Wt 140 lb (63.5 kg)   LMP 06/27/2021   SpO2 97%   Breastfeeding No   BMI 27.34 kg/m     Wt Readings from Last 3 Encounters:  03/22/22 142 lb (64.4 kg)  03/20/22 141 lb 11.2 oz (64.3 kg)  03/13/22 140 lb 6.4 oz (63.7 kg)     GEN:  Well nourished, well developed in no  acute distress HEENT: Normal NECK: No JVD; No carotid bruits LYMPHATICS: No lymphadenopathy CARDIAC: RRR, no murmurs, rubs, gallops RESPIRATORY:  Clear to auscultation without rales, wheezing or rhonchi  ABDOMEN: Soft, non-tender, non-distended MUSCULOSKELETAL:  No edema; No deformity  SKIN: Warm and dry NEUROLOGIC:  Alert and oriented x 3 PSYCHIATRIC:  Normal affect    Risk Assessment/Risk Calculators:     CARPREG II Risk Prediction Index Score:  1.  The patient's risk for a primary cardiac event is 5%.            ASSESSMENT & PLAN:    Palpations  Her blood pressure in the office today is within normal. She was able to get her echocardiogram which is within normal.  Unfortunately we do not have the report of her ZIO monitor which she sent a while back.  We will reach out to you to make sure that this information is put in place.   Addendum 03/29/2019: Her monitor was able to be located by the company.  They had initially lost this.  So they were able to gather some data from this.  Overall after my review this is a otherwise normal study.  No evidence of any arrhythmia.  Information will be shared with the patient.  Patient Instructions  Medication Instructions:  Your physician recommends that you continue on your current medications as directed. Please refer to the Current Medication list given to you today.  Please take your blood pressure daily for 2 weeks and send in a MyChart message. Please include heart rates. If your blood pressure is greater then 130/80 for two days in a row please contact my office.   HOW TO TAKE YOUR BLOOD PRESSURE: Rest 5 minutes before taking your blood pressure. Don't smoke or drink caffeinated beverages for at least 30 minutes before. Take your blood pressure before (not after) you eat. Sit comfortably with your back supported and both feet on the floor (don't cross your legs). Elevate your arm to heart level on a table or a desk. Use the  proper sized cuff. It should fit smoothly and snugly around your bare upper arm. There should be enough room to slip a fingertip under the cuff. The bottom edge of the cuff should be 1 inch above  the crease of the elbow. Ideally, take 3 measurements at one sitting and record the average.  *If you need a refill on your cardiac medications before your next appointment, please call your pharmacy*   Lab Work: None   Testing/Procedures: None   Follow-Up: At [facility], you and your health needs are our priority.  As part of our continuing mission to provide you with exceptional heart care, we have created designated Provider Care Teams.  These Care Teams include your primary Cardiologist (physician) and Advanced Practice Providers (APPs -  Physician Assistants and Nurse Practitioners) who all work together to provide you with the care you need, when you need it.  We recommend signing up for the patient portal called "MyChart".  Sign up information is provided on this After Visit Summary.  MyChart is used to connect with patients for Virtual Visits (Telemedicine).  Patients are able to view lab/test results, encounter notes, upcoming appointments, etc.  Non-urgent messages can be sent to your provider as well.   To learn more about what you can do with MyChart, go to [email or web address].    Your next appointment:   12 week(s)  The format for your next appointment:   In Person  Provider:   Berniece Salines  [facility] [address] [place], [place] [address]  Other Instructions    Important Information About Sugar         Dispo:  Return in about 12 weeks (around 05/31/2022).   Medication Adjustments/Labs and Tests Ordered: Current medicines are reviewed at length with the patient today.  Concerns regarding medicines are outlined above.  Tests Ordered: No orders of the defined types were placed in this encounter.  Medication Changes: No orders of the  defined types were placed in this encounter.

## 2022-03-08 NOTE — Patient Instructions (Addendum)
Medication Instructions:  Your physician recommends that you continue on your current medications as directed. Please refer to the Current Medication list given to you today.  Please take your blood pressure daily for 2 weeks and send in a MyChart message. Please include heart rates. If your blood pressure is greater then 130/80 for two days in a row please contact my office.   HOW TO TAKE YOUR BLOOD PRESSURE: Rest 5 minutes before taking your blood pressure. Don't smoke or drink caffeinated beverages for at least 30 minutes before. Take your blood pressure before (not after) you eat. Sit comfortably with your back supported and both feet on the floor (don't cross your legs). Elevate your arm to heart level on a table or a desk. Use the proper sized cuff. It should fit smoothly and snugly around your bare upper arm. There should be enough room to slip a fingertip under the cuff. The bottom edge of the cuff should be 1 inch above the crease of the elbow. Ideally, take 3 measurements at one sitting and record the average.  *If you need a refill on your cardiac medications before your next appointment, please call your pharmacy*   Lab Work: None   Testing/Procedures: None   Follow-Up: At [facility], you and your health needs are our priority.  As part of our continuing mission to provide you with exceptional heart care, we have created designated Provider Care Teams.  These Care Teams include your primary Cardiologist (physician) and Advanced Practice Providers (APPs -  Physician Assistants and Nurse Practitioners) who all work together to provide you with the care you need, when you need it.  We recommend signing up for the patient portal called "MyChart".  Sign up information is provided on this After Visit Summary.  MyChart is used to connect with patients for Virtual Visits (Telemedicine).  Patients are able to view lab/test results, encounter notes, upcoming appointments, etc.   Non-urgent messages can be sent to your provider as well.   To learn more about what you can do with MyChart, go to [email or web address].    Your next appointment:   12 week(s)  The format for your next appointment:   In Person  Provider:   Berniece Salines  [facility] [address], [place], [place] [address]  Other Instructions    Important Information About Sugar

## 2022-03-10 LAB — CULTURE, BETA STREP (GROUP B ONLY): Strep Gp B Culture: NEGATIVE

## 2022-03-13 ENCOUNTER — Other Ambulatory Visit: Payer: Self-pay

## 2022-03-13 ENCOUNTER — Ambulatory Visit (INDEPENDENT_AMBULATORY_CARE_PROVIDER_SITE_OTHER): Payer: Medicaid Other | Admitting: Certified Nurse Midwife

## 2022-03-13 ENCOUNTER — Encounter: Payer: Medicaid Other | Admitting: Certified Nurse Midwife

## 2022-03-13 VITALS — BP 105/64 | HR 83 | Wt 140.4 lb

## 2022-03-13 DIAGNOSIS — O0993 Supervision of high risk pregnancy, unspecified, third trimester: Secondary | ICD-10-CM | POA: Diagnosis not present

## 2022-03-13 DIAGNOSIS — Z23 Encounter for immunization: Secondary | ICD-10-CM | POA: Diagnosis not present

## 2022-03-13 DIAGNOSIS — O09523 Supervision of elderly multigravida, third trimester: Secondary | ICD-10-CM

## 2022-03-13 DIAGNOSIS — Z3A37 37 weeks gestation of pregnancy: Secondary | ICD-10-CM

## 2022-03-13 MED ORDER — BREAST PUMP MISC: 0 refills | Status: DC

## 2022-03-14 ENCOUNTER — Inpatient Hospital Stay (HOSPITAL_COMMUNITY)
Admission: AD | Admit: 2022-03-14 | Discharge: 2022-03-14 | Disposition: A | Discharge status: Home / Self Care | Payer: Medicaid Other | Attending: Obstetrics & Gynecology | Admitting: Obstetrics & Gynecology

## 2022-03-14 ENCOUNTER — Other Ambulatory Visit: Payer: Self-pay

## 2022-03-14 ENCOUNTER — Encounter (HOSPITAL_COMMUNITY): Payer: Self-pay | Admitting: Obstetrics & Gynecology

## 2022-03-14 DIAGNOSIS — Z3A37 37 weeks gestation of pregnancy: Secondary | ICD-10-CM | POA: Diagnosis not present

## 2022-03-14 DIAGNOSIS — D684 Acquired coagulation factor deficiency: Secondary | ICD-10-CM

## 2022-03-14 DIAGNOSIS — O471 False labor at or after 37 completed weeks of gestation: Secondary | ICD-10-CM | POA: Diagnosis not present

## 2022-03-14 DIAGNOSIS — O09523 Supervision of elderly multigravida, third trimester: Secondary | ICD-10-CM

## 2022-03-14 DIAGNOSIS — O0990 Supervision of high risk pregnancy, unspecified, unspecified trimester: Secondary | ICD-10-CM

## 2022-03-14 HISTORY — DX: Encounter for other specified aftercare: Z51.89

## 2022-03-14 NOTE — Progress Notes (Signed)
   PRENATAL VISIT NOTE  Subjective:  Michelle Guzman is a 36 y.o. [phone] at [redacted]w[redacted]d being seen today for ongoing prenatal care.  She is currently monitored for the following issues for this high-risk pregnancy and has Supervision of high risk pregnancy, antepartum; AMA (advanced maternal age) multigravida 63+; History of preterm delivery; Dietary lactose intolerance; and Vitamin K deficiency coagulation disorder ([place]) on their problem list.  Patient reports no complaints.  Contractions: Irritability.  .  Movement: Present. Denies leaking of fluid.   The following portions of the patient's history were reviewed and updated as appropriate: allergies, current medications, past family history, past medical history, past social history, past surgical history and problem list.   Objective:   Vitals:   03/13/22 0917  BP: 105/64  Pulse: 83  Weight: 140 lb 6.4 oz (63.7 kg)    Fetal Status: Fetal Heart Rate (bpm): 140 Fundal Height: 37 cm Movement: Present     General:  Alert, oriented and cooperative. Patient is in no acute distress.  Skin: Skin is warm and dry. No rash noted.   Cardiovascular: Normal heart rate noted  Respiratory: Normal respiratory effort, no problems with respiration noted  Abdomen: Soft, gravid, appropriate for gestational age.  Pain/Pressure: Present     Pelvic: Cervical exam deferred        Extremities: Normal range of motion.  Edema: None  Mental Status: Normal mood and affect. Normal behavior. Normal judgment and thought content.   Assessment and Plan:  Pregnancy: [id number] at [redacted]w[redacted]d 1. Supervision of high risk pregnancy in third trimester - Doing well, feeling regular and vigorous fetal movement  - Tdap vaccine greater than or equal to 7yo IM  2. [redacted] weeks gestation of pregnancy - Routine OB care  - Misc. Devices (BREAST PUMP) MISC; Dispense one breast pump for patient  Dispense: 1 each; Refill: 0  3. Multigravida of advanced maternal age in third  trimester - Taking low dose aspirin daily  Term labor symptoms and general obstetric precautions including but not limited to vaginal bleeding, contractions, leaking of fluid and fetal movement were reviewed in detail with the patient. Please refer to After Visit Summary for other counseling recommendations.   Return in about 1 week (around 03/20/2022) for IN-PERSON, [place].  Future Appointments  Date Time Provider [place]  03/20/2022  3:35 PM Helaine Chess [facility]  03/25/2022 10:45 AM [facility] None  03/25/2022 11:20 AM Orson Slick, MD [facility] None  03/27/2022  3:15 PM Gabriel Carina, CNM [facility]  05/24/2022  2:00 PM Tobb, Godfrey Pick, DO [facility] None    Gabriel Carina, CNM

## 2022-03-14 NOTE — MAU Note (Signed)
Michelle Guzman is a 36 y.o. at [redacted]w[redacted]d here in MAU reporting: having ctxs 2-7 minutes apart that began @ 0400 this morning.  Denies VB or LOF.  Endorses +FM, less than usual. LMP: NB/A Onset of complaint: today Pain score: 7 Vitals:   03/14/22 1130  BP: 122/67  Pulse: 71  Resp: 20  Temp: 98.3 F (36.8 C)  SpO2: 100%     FHT:144 Lab orders placed from triage:   None

## 2022-03-14 NOTE — Discharge Instructions (Signed)

## 2022-03-14 NOTE — MAU Provider Note (Signed)
None      S: Ms. Marnita Poirier is a 36 y.o. [id number] at [redacted]w[redacted]d  who presents to MAU today complaining contractions q 2-4 minutes since 0400. She denies vaginal bleeding. She denies LOF. She reports normal fetal movement.    O: BP 122/67 (BP Location: Right Arm)   Pulse 71   Temp 98.3 F (36.8 C) (Oral)   Resp 20   LMP 06/27/2021   SpO2 100%  GENERAL: Well-developed, well-nourished female in no acute distress.  HEAD: Normocephalic, atraumatic.  CHEST: Normal effort of breathing, regular heart rate ABDOMEN: Soft, nontender, gravid  Cervical exam:  Dilation: 3.5 Effacement (%): 70 Station: Ballotable Presentation: Vertex Exam by:: Truitt Leep, RNC   Fetal Monitoring: Baseline: 130 Variability: moderate Accelerations: 15x15 Decelerations: none Contractions: 4-8  Rechecked after 1.5 hours and unchanged  A: SIUP at [redacted]w[redacted]d  False labor  P: -Discharge home in stable condition -Labor precautions discussed -Patient advised to follow-up with OB as scheduled for prenatal care -Patient may return to MAU as needed or if her condition were to change or worsen   Wende Mott, CNM 03/14/2022 1:10 PM

## 2022-03-20 ENCOUNTER — Ambulatory Visit (INDEPENDENT_AMBULATORY_CARE_PROVIDER_SITE_OTHER): Payer: Medicaid Other | Admitting: Certified Nurse Midwife

## 2022-03-20 ENCOUNTER — Other Ambulatory Visit: Payer: Self-pay

## 2022-03-20 VITALS — BP 115/67 | HR 63 | Wt 141.7 lb

## 2022-03-20 DIAGNOSIS — Z3A38 38 weeks gestation of pregnancy: Secondary | ICD-10-CM

## 2022-03-20 DIAGNOSIS — O0993 Supervision of high risk pregnancy, unspecified, third trimester: Secondary | ICD-10-CM

## 2022-03-20 DIAGNOSIS — O09523 Supervision of elderly multigravida, third trimester: Secondary | ICD-10-CM

## 2022-03-20 NOTE — Patient Instructions (Signed)
[employer]  This circuit takes at least 90 minutes to complete so clear your schedule and make mental preparations so you can relax in your environment. The second step requires a lot of pillows so gather them up before beginning Before starting, you should empty your bladder! Have a nice drink nearby, and make sure it has a straw! If you are having contractions, this circuit should be done through contractions, try not to change positions between steps  This circuit is useful to help get the baby lined up, ideally, in the "Left Occiput Anterior" (LOA) Position, both before labor begins and when some corrections need to be done during labor.   Step One: Open-knee Chest Stay in this position for 30 minutes, start in cat/cow, then drop your chest as low as you can to the bed or the floor and your bottom as high as you can. Knees should be fairly wide apart, and the angle between the torso/thighs should be wider than 90 degrees. Wiggle around, prop with lots of pillows and use this time to get totally relaxed. This position allows the baby to scoot out of the pelvis a bit and gives them room to rotate, shift their head position, etc. If the pregnant person finds it helpful, careful positioning with a rebozo under the belly, with gentle tension from a support person behind can help maintain this position for the full 30 minutes.  Step UMP:[id number] Left Side Lying Roll to your left side, bringing your top leg as high as possible and keeping your bottom leg straight. Roll forward as much as possible, again using a lot of pillows. Sink into the bed and relax some more. If you fall asleep, that's totally okay and you can stay there! If not, stay here for at least another half an hour. Try and get your top right leg up towards your head and get as rolled over onto your belly as much as possible. If you repeat the circuit during labor, try alternating left and right  sides. We know the photo the left is actually right side... just flip the image in your head.  Step Three: Moving and Lunges Lunge, walk stairs facing sideways, 2 at a time, (have a spotter downstairs of you!), take a walk outside with one foot on the curb and the other on the street, sit on a birth ball and hula- anything that's upright and putting your pelvis in open, asymmetrical positions. Spend at least 30 minutes doing this one as well to give your baby a chance to move down. If you are lunging or stair or curb walking, you should lunge/walk/go up stairs in the direction that feels better to you. The key with the lunge is that the toes of the higher leg and mom's belly button should be at right angles. Do not lunge over your knee, that closes the pelvis.  Van Creator - [email or web address] Greggory Stallion, CD, BDT (DONA), LCCE, FACCE: Supporting Content - [email or web address] Jon Gills: Photography - [email or web address] Trina Ao CD/CDT [facility]): Print and Webmaster - [email or web address]

## 2022-03-21 NOTE — Progress Notes (Signed)
   PRENATAL VISIT NOTE  Subjective:  Michelle Guzman is a 36 y.o. [phone] at [redacted]w[redacted]d being seen today for ongoing prenatal care.  She is currently monitored for the following issues for this high-risk pregnancy and has Supervision of high risk pregnancy, antepartum; AMA (advanced maternal age) multigravida 42+; History of preterm delivery; Dietary lactose intolerance; and Vitamin K deficiency coagulation disorder ([place]) on their problem list.  Patient reports  having regular contractions but they will wax and wane. Was 3cm on last check .  Contractions: Irritability. Vag. Bleeding: None.  Movement: Present. Denies leaking of fluid.   The following portions of the patient's history were reviewed and updated as appropriate: allergies, current medications, past family history, past medical history, past social history, past surgical history and problem list.   Objective:   Vitals:   03/20/22 1550  BP: 115/67  Pulse: 63  Weight: 141 lb 11.2 oz (64.3 kg)   Fetal Status: Fetal Heart Rate (bpm): 134 Fundal Height: 38 cm Movement: Present     General:  Alert, oriented and cooperative. Patient is in no acute distress.  Skin: Skin is warm and dry. No rash noted.   Cardiovascular: Normal heart rate noted  Respiratory: Normal respiratory effort, no problems with respiration noted  Abdomen: Soft, gravid, appropriate for gestational age.  Pain/Pressure: Present     Pelvic: Cervical exam deferred        Extremities: Normal range of motion.  Edema: None  Mental Status: Normal mood and affect. Normal behavior. Normal judgment and thought content.   Assessment and Plan:  Pregnancy: [id number] at [redacted]w[redacted]d 1. Supervision of high risk pregnancy in third trimester - Doing well overall, feeling regular and vigorous fetal movement   2. [redacted] weeks gestation of pregnancy - Routine OB care  - Suggested she do the [place] to facilitate either effective contractions or stop ineffective  contractions.  3. Multigravida of advanced maternal age in third trimester - Taking daily low dose aspirin  Term labor symptoms and general obstetric precautions including but not limited to vaginal bleeding, contractions, leaking of fluid and fetal movement were reviewed in detail with the patient. Please refer to After Visit Summary for other counseling recommendations.   Return in about 1 week (around 03/27/2022) for IN-PERSON, [place].  Future Appointments  Date Time Provider [place]  03/25/2022 10:45 AM [facility] None  03/25/2022 11:20 AM Orson Slick, MD [facility] None  03/27/2022  3:15 PM Gabriel Carina, CNM [facility]  05/24/2022  2:00 PM Tobb, Godfrey Pick, DO [facility] None    Gabriel Carina, CNM

## 2022-03-22 ENCOUNTER — Encounter (HOSPITAL_COMMUNITY): Payer: Self-pay | Admitting: Obstetrics & Gynecology

## 2022-03-22 ENCOUNTER — Encounter: Payer: Self-pay | Admitting: Certified Nurse Midwife

## 2022-03-22 ENCOUNTER — Inpatient Hospital Stay (HOSPITAL_COMMUNITY)
Admission: AD | Admit: 2022-03-22 | Discharge: 2022-03-22 | Disposition: A | Discharge status: Home / Self Care | Payer: Medicaid Other | Attending: Obstetrics & Gynecology | Admitting: Obstetrics & Gynecology

## 2022-03-22 DIAGNOSIS — Z3A38 38 weeks gestation of pregnancy: Secondary | ICD-10-CM | POA: Diagnosis not present

## 2022-03-22 DIAGNOSIS — O0990 Supervision of high risk pregnancy, unspecified, unspecified trimester: Secondary | ICD-10-CM

## 2022-03-22 DIAGNOSIS — O99113 Other diseases of the blood and blood-forming organs and certain disorders involving the immune mechanism complicating pregnancy, third trimester: Secondary | ICD-10-CM | POA: Diagnosis not present

## 2022-03-22 DIAGNOSIS — O09523 Supervision of elderly multigravida, third trimester: Secondary | ICD-10-CM | POA: Diagnosis not present

## 2022-03-22 DIAGNOSIS — Z0371 Encounter for suspected problem with amniotic cavity and membrane ruled out: Secondary | ICD-10-CM

## 2022-03-22 DIAGNOSIS — O471 False labor at or after 37 completed weeks of gestation: Secondary | ICD-10-CM

## 2022-03-22 DIAGNOSIS — D684 Acquired coagulation factor deficiency: Secondary | ICD-10-CM | POA: Diagnosis not present

## 2022-03-22 LAB — POCT FERN TEST: POCT Fern Test: NEGATIVE

## 2022-03-22 LAB — AMNISURE RUPTURE OF MEMBRANE (ROM) NOT AT ARMC: Amnisure ROM: NEGATIVE

## 2022-03-22 NOTE — MAU Provider Note (Signed)
Event Date/Time   First Provider Initiated Contact with Patient 03/22/22 1401       S: Ms. Michelle Guzman is a 36 y.o. [id number] at [redacted]w[redacted]d  who presents to MAU today complaining of leaking of fluid since 0700. She reports an initial gush at that time, but has not had any further leaking. She denies vaginal bleeding. She endorses contractions. She reports normal fetal movement.    O: BP 119/73   Pulse 71   Temp 98.1 F (36.7 C)   Resp 18   Ht 5' (1.524 m)   Wt 64.4 kg   LMP 06/27/2021   BMI 27.73 kg/m  GENERAL: Well-developed, well-nourished female in no acute distress.  HEAD: Normocephalic, atraumatic.  CHEST: Normal effort of breathing, regular heart rate ABDOMEN: Soft, nontender, gravid   Cervical exam:  Dilation: 3 Effacement (%): 50 Cervical Position: Posterior Station: Ballotable Presentation: Vertex Exam by:: Elray Mcgregor, RN   Fetal Monitoring: Baseline: 130 Variability: moderate Accelerations: 15x15 Decelerations: none Contractions: irregular   Results for orders placed or performed during the hospital encounter of 03/22/22 (from the past 24 hour(s))  POCT fern test     Status: None   Collection Time: 03/22/22  2:31 PM  Result Value Ref Range   POCT Fern Test Negative = intact amniotic membranes   Amnisure rupture of membrane (rom)not at [facility]     Status: None   Collection Time: 03/22/22  2:36 PM  Result Value Ref Range   Amnisure ROM NEGATIVE      A: 1. Supervision of high risk pregnancy, antepartum   2. Multigravida of advanced maternal age in third trimester   3. Vitamin K deficiency coagulation disorder ([place])   4. False labor after 37 completed weeks of gestation   5. Encounter for suspected premature rupture of membranes, with rupture of membranes not found   6. [redacted] weeks gestation of pregnancy      P: DC home in stable condition  3rd Trimester precautions  Labor precautions  Fetal kick counts RX: none  Return to MAU as  needed FU with OB as planned   [place] for [place] at [facility] Follow up.   Specialty: Obstetrics and Gynecology Contact information: [place] [id number] [place] DNP, CNM  03/22/22  3:27 PM

## 2022-03-22 NOTE — MAU Note (Signed)
.  Michelle Guzman is a 36 y.o. at [redacted]w[redacted]d here in MAU reporting: ctx started around 6am. Now  about 4 min apart. Stated she had a small amount of clear fluid leak out when she first got up this morning. None since. Good fetal movement felt. 3cm dilated last week.  LMP:  Onset of complaint: 6am Pain score: 6-8 There were no vitals filed for this visit.   FHT:143 Lab orders placed from triage:  labor eval

## 2022-03-23 ENCOUNTER — Inpatient Hospital Stay (HOSPITAL_COMMUNITY)
Admission: AD | Admit: 2022-03-23 | Discharge: 2022-03-25 | DRG: 807 | Disposition: A | Discharge status: Home / Self Care | Payer: Medicaid Other | Attending: Family Medicine | Admitting: Family Medicine

## 2022-03-23 ENCOUNTER — Encounter: Payer: Self-pay | Admitting: Certified Nurse Midwife

## 2022-03-23 DIAGNOSIS — Z3490 Encounter for supervision of normal pregnancy, unspecified, unspecified trimester: Secondary | ICD-10-CM

## 2022-03-23 DIAGNOSIS — D563 Thalassemia minor: Secondary | ICD-10-CM | POA: Diagnosis present

## 2022-03-23 DIAGNOSIS — Z3A38 38 weeks gestation of pregnancy: Secondary | ICD-10-CM

## 2022-03-23 DIAGNOSIS — E561 Deficiency of vitamin K: Secondary | ICD-10-CM | POA: Diagnosis present

## 2022-03-23 DIAGNOSIS — O99284 Endocrine, nutritional and metabolic diseases complicating childbirth: Secondary | ICD-10-CM | POA: Diagnosis present

## 2022-03-24 ENCOUNTER — Other Ambulatory Visit: Payer: Self-pay

## 2022-03-24 ENCOUNTER — Encounter (HOSPITAL_COMMUNITY): Payer: Self-pay | Admitting: Family Medicine

## 2022-03-24 DIAGNOSIS — O09523 Supervision of elderly multigravida, third trimester: Secondary | ICD-10-CM

## 2022-03-24 DIAGNOSIS — O26893 Other specified pregnancy related conditions, third trimester: Secondary | ICD-10-CM | POA: Diagnosis present

## 2022-03-24 DIAGNOSIS — D563 Thalassemia minor: Secondary | ICD-10-CM | POA: Diagnosis present

## 2022-03-24 DIAGNOSIS — O99284 Endocrine, nutritional and metabolic diseases complicating childbirth: Secondary | ICD-10-CM | POA: Diagnosis present

## 2022-03-24 DIAGNOSIS — E561 Deficiency of vitamin K: Secondary | ICD-10-CM | POA: Diagnosis present

## 2022-03-24 DIAGNOSIS — Z3490 Encounter for supervision of normal pregnancy, unspecified, unspecified trimester: Secondary | ICD-10-CM

## 2022-03-24 DIAGNOSIS — Z3A38 38 weeks gestation of pregnancy: Secondary | ICD-10-CM

## 2022-03-24 LAB — CBC
HCT: 29.9 % — ABNORMAL LOW (ref 36.0–46.0)
Hemoglobin: 10.1 g/dL — ABNORMAL LOW (ref 12.0–15.0)
MCH: 30.1 pg (ref 26.0–34.0)
MCHC: 33.8 g/dL (ref 30.0–36.0)
MCV: 89 fL (ref 80.0–100.0)
Platelets: 210 10*3/uL (ref 150–400)
RBC: 3.36 MIL/uL — ABNORMAL LOW (ref 3.87–5.11)
RDW: 13.2 % (ref 11.5–15.5)
WBC: 12.1 10*3/uL — ABNORMAL HIGH (ref 4.0–10.5)
nRBC: 0 % (ref 0.0–0.2)

## 2022-03-24 MED ORDER — OXYCODONE-ACETAMINOPHEN 5-325 MG PO TABS: 1.0000 | ORAL_TABLET | ORAL | Status: DC | PRN

## 2022-03-24 MED ORDER — DIBUCAINE (PERIANAL) 1 % EX OINT: 1.0000 | TOPICAL_OINTMENT | CUTANEOUS | Status: DC | PRN

## 2022-03-24 MED ORDER — IBUPROFEN 600 MG PO TABS
600.0000 mg | ORAL_TABLET | Freq: Four times a day (QID) | ORAL | Status: DC
  Administered 2022-03-24 – 2022-03-25 (×5): 600 mg via ORAL
  Filled 2022-03-24 (×5): qty 1

## 2022-03-24 MED ORDER — OXYTOCIN-SODIUM CHLORIDE 30-0.9 UT/500ML-% IV SOLN: 2.5000 [IU]/h | INTRAVENOUS | Status: DC

## 2022-03-24 MED ORDER — OXYCODONE-ACETAMINOPHEN 5-325 MG PO TABS
1.0000 | ORAL_TABLET | ORAL | Status: DC | PRN
  Administered 2022-03-24: 1 via ORAL
  Filled 2022-03-24: qty 1

## 2022-03-24 MED ORDER — DIPHENHYDRAMINE HCL 25 MG PO CAPS: 25.0000 mg | ORAL_CAPSULE | Freq: Four times a day (QID) | ORAL | Status: DC | PRN

## 2022-03-24 MED ORDER — ONDANSETRON HCL 4 MG/2ML IJ SOLN
4.0000 mg | INTRAMUSCULAR | Status: DC | PRN
  Filled 2022-03-24: qty 2

## 2022-03-24 MED ORDER — LACTATED RINGERS IV SOLN: 500.0000 mL | INTRAVENOUS | Status: DC | PRN

## 2022-03-24 MED ORDER — METHYLERGONOVINE MALEATE 0.2 MG/ML IJ SOLN
INTRAMUSCULAR | Status: AC
  Filled 2022-03-24: qty 1

## 2022-03-24 MED ORDER — ONDANSETRON HCL 4 MG/2ML IJ SOLN
4.0000 mg | Freq: Four times a day (QID) | INTRAMUSCULAR | Status: DC | PRN
  Administered 2022-03-24: 4 mg via INTRAVENOUS
  Filled 2022-03-24: qty 2

## 2022-03-24 MED ORDER — METHYLERGONOVINE MALEATE 0.2 MG/ML IJ SOLN: 0.2000 mg | Freq: Once | INTRAMUSCULAR | Status: DC

## 2022-03-24 MED ORDER — OXYTOCIN 10 UNIT/ML IJ SOLN
INTRAMUSCULAR | Status: AC
  Filled 2022-03-24: qty 2

## 2022-03-24 MED ORDER — TRANEXAMIC ACID-NACL 1000-0.7 MG/100ML-% IV SOLN
1000.0000 mg | INTRAVENOUS | Status: AC
  Administered 2022-03-24: 1000 mg via INTRAVENOUS
  Filled 2022-03-24: qty 100

## 2022-03-24 MED ORDER — SIMETHICONE 80 MG PO CHEW: 80.0000 mg | CHEWABLE_TABLET | ORAL | Status: DC | PRN

## 2022-03-24 MED ORDER — TETANUS-DIPHTH-ACELL PERTUSSIS 5-2.5-18.5 LF-MCG/0.5 IM SUSY: 0.5000 mL | PREFILLED_SYRINGE | Freq: Once | INTRAMUSCULAR | Status: DC

## 2022-03-24 MED ORDER — ACETAMINOPHEN 325 MG PO TABS: 650.0000 mg | ORAL_TABLET | ORAL | Status: DC | PRN

## 2022-03-24 MED ORDER — MAGNESIUM HYDROXIDE 400 MG/5ML PO SUSP: 30.0000 mL | ORAL | Status: DC | PRN

## 2022-03-24 MED ORDER — OXYTOCIN-SODIUM CHLORIDE 30-0.9 UT/500ML-% IV SOLN
INTRAVENOUS | Status: AC
  Administered 2022-03-24: 333 mL via INTRAVENOUS
  Filled 2022-03-24: qty 500

## 2022-03-24 MED ORDER — METHYLERGONOVINE MALEATE 0.2 MG PO TABS: 0.2000 mg | ORAL_TABLET | ORAL | Status: DC | PRN

## 2022-03-24 MED ORDER — LIDOCAINE HCL (PF) 1 % IJ SOLN: 30.0000 mL | INTRAMUSCULAR | Status: DC | PRN

## 2022-03-24 MED ORDER — TRANEXAMIC ACID-NACL 1000-0.7 MG/100ML-% IV SOLN
INTRAVENOUS | Status: AC
  Filled 2022-03-24: qty 100

## 2022-03-24 MED ORDER — SENNA 8.6 MG PO TABS
1.0000 | ORAL_TABLET | Freq: Every day | ORAL | Status: DC
  Administered 2022-03-24: 8.6 mg via ORAL
  Filled 2022-03-24 (×2): qty 1

## 2022-03-24 MED ORDER — OXYTOCIN BOLUS FROM INFUSION: 333.0000 mL | Freq: Once | INTRAVENOUS | Status: AC

## 2022-03-24 MED ORDER — MEASLES, MUMPS & RUBELLA VAC IJ SOLR: 0.5000 mL | Freq: Once | INTRAMUSCULAR | Status: DC

## 2022-03-24 MED ORDER — MISOPROSTOL 200 MCG PO TABS: 800.0000 ug | ORAL_TABLET | Freq: Once | ORAL | Status: DC

## 2022-03-24 MED ORDER — PROMETHAZINE HCL 25 MG PO TABS
25.0000 mg | ORAL_TABLET | Freq: Once | ORAL | Status: AC
  Administered 2022-03-25: 25 mg via ORAL
  Filled 2022-03-24: qty 1

## 2022-03-24 MED ORDER — OXYCODONE-ACETAMINOPHEN 5-325 MG PO TABS: 2.0000 | ORAL_TABLET | ORAL | Status: DC | PRN

## 2022-03-24 MED ORDER — ONDANSETRON HCL 4 MG PO TABS
4.0000 mg | ORAL_TABLET | ORAL | Status: DC | PRN
  Administered 2022-03-24: 4 mg via ORAL
  Filled 2022-03-24: qty 1

## 2022-03-24 MED ORDER — COCONUT OIL OIL: 1.0000 | TOPICAL_OIL | Status: DC | PRN

## 2022-03-24 MED ORDER — SOD CITRATE-CITRIC ACID 500-334 MG/5ML PO SOLN: 30.0000 mL | ORAL | Status: DC | PRN

## 2022-03-24 MED ORDER — LACTATED RINGERS IV SOLN: INTRAVENOUS | Status: DC

## 2022-03-24 MED ORDER — BENZOCAINE-MENTHOL 20-0.5 % EX AERO: 1.0000 | INHALATION_SPRAY | CUTANEOUS | Status: DC | PRN

## 2022-03-24 MED ORDER — MEDROXYPROGESTERONE ACETATE 150 MG/ML IM SUSP: 150.0000 mg | INTRAMUSCULAR | Status: DC | PRN

## 2022-03-24 MED ORDER — PRENATAL MULTIVITAMIN CH
1.0000 | ORAL_TABLET | Freq: Every day | ORAL | Status: DC
  Administered 2022-03-24: 1 via ORAL
  Filled 2022-03-24: qty 1

## 2022-03-24 MED ORDER — MISOPROSTOL 200 MCG PO TABS
ORAL_TABLET | ORAL | Status: AC
  Filled 2022-03-24: qty 5

## 2022-03-24 MED ORDER — WITCH HAZEL-GLYCERIN EX PADS: 1.0000 | MEDICATED_PAD | CUTANEOUS | Status: DC | PRN

## 2022-03-24 NOTE — Discharge Summary (Cosign Needed Addendum)
Postpartum Discharge Summary       Patient Name: Michelle Guzman DOB: 1986-04-02 MRN: [id number]  Date of admission: 03/23/2022 Delivery date:03/24/2022  Delivering provider: Manya Silvas  Date of discharge: 03/25/2022  Admitting diagnosis: Spontaneous onset of labor after 80 but before 1 completed weeks gestation with delivery by planned cesarean section [O75.82] Pregnancy [Z34.90] Intrauterine pregnancy: [redacted]w[redacted]d    Secondary diagnosis:  Principal Problem:   Spontaneous onset of labor after 366but before 362completed weeks gestation with delivery by planned cesarean section Active Problems:   Pregnancy   Vaginal delivery  Additional diagnosis: Vitamin K deficiency  Discharge diagnosis: Term Pregnancy Delivered                                              Post partum procedures:None Augmentation: N/A Complications: None  Hospital course: Onset of Labor With Vaginal Delivery      36y.o. yo [id number] admitted in Active Labor on 03/23/2022. Labor course was uncomplicated.   Membrane Rupture Time/Date: 7:20 PM ,03/24/2022   Delivery Method:Vaginal, Spontaneous  Episiotomy: None  Lacerations:  1st degree;Vaginal  Patient had a postpartum course complicated by bleeding for which she received TXA and started a methergine series. Her bleeding was under control before discharge. She did not have enough bleeding to qualify as post partum hemorrhage.  She is ambulating, tolerating a regular diet, passing flatus, and urinating well. Patient is discharged home in stable condition on 03/25/22.  Newborn Data: Birth date:03/24/2022  Birth time:12:58 AM  Gender:Female  Living status:Living  Apgars:7 ,9  Weight:3544 g   Magnesium Sulfate received: No BMZ received: No Rhophylac:N/A MMR:N/A T-DaP:Given postpartum Flu: No Transfusion:No  Physical exam  Vitals:   03/24/22 0830 03/24/22 1213 03/24/22 1930 03/25/22 0500  BP: 107/71 102/65 120/75  106/68  Pulse: 67 66 60 73  Resp: 17 18 18 19   Temp: 98 F (36.7 C) 98 F (36.7 C) 98.4 F (36.9 C)   TempSrc: Oral Oral Oral   SpO2: 100% 100% 100% 99%   General: alert and cooperative Lochia: appropriate Uterine Fundus: firm Incision: N/A DVT Evaluation: No evidence of DVT seen on physical exam. Labs: Lab Results  Component Value Date   WBC 12.1 (H) 03/24/2022   HGB 10.1 (L) 03/24/2022   HCT 29.9 (L) 03/24/2022   MCV 89.0 03/24/2022   PLT 210 03/24/2022      Latest Ref Rng & Units 12/13/2021    8:02 AM  CMP  Glucose 70 - 99 mg/dL 77   BUN 6 - 20 mg/dL 6   Creatinine 0.44 - 1.00 mg/dL 0.55   Sodium 135 - 145 mmol/L 135   Potassium 3.5 - 5.1 mmol/L 3.7   Chloride 98 - 111 mmol/L 104   CO2 22 - 32 mmol/L 26   Calcium 8.9 - 10.3 mg/dL 8.6   Total Protein 6.5 - 8.1 g/dL 6.5   Total Bilirubin 0.3 - 1.2 mg/dL 0.4   Alkaline Phos 38 - 126 U/L 74   AST 15 - 41 U/L 10   ALT 0 - 44 U/L 8    Edinburgh Score:    03/24/2022    8:30 AM  Edinburgh Postnatal Depression Scale Screening Tool  I have been able to laugh and see the funny side of things. 0  I have looked forward with  enjoyment to things. 0  I have blamed myself unnecessarily when things went wrong. 0  I have been anxious or worried for no good reason. 1  I have felt scared or panicky for no good reason. 0  Things have been getting on top of me. 1  I have been so unhappy that I have had difficulty sleeping. 0  I have felt sad or miserable. 1  I have been so unhappy that I have been crying. 0  The thought of harming myself has occurred to me. 0  Edinburgh Postnatal Depression Scale Total 3     After visit meds:  Allergies as of 03/25/2022       Reactions   Lactose Intolerance (gi) Nausea And Vomiting, Other (See Comments)   headaches   Orange Juice [orange Oil] Swelling        Medication List     TAKE these medications    Blood Pressure Monitoring Devi 1 each by Does not apply route once a  week.   Breast Pump Misc Dispense one breast pump for patient   methylergonovine 0.2 MG tablet Commonly known as: METHERGINE Take 1 tablet (0.2 mg total) by mouth every 4 (four) hours as needed (excessive bleeding).   PRENATAL PO Take by mouth.         Discharge home in stable condition Infant Feeding: Breast Infant Disposition:home with mother Discharge instruction: per After Visit Summary and Postpartum booklet. Activity: Advance as tolerated. Pelvic rest for 6 weeks.  Diet: routine diet Future Appointments: Future Appointments  Date Time Provider [place]  03/25/2022 10:45 AM [facility] None  03/25/2022 11:20 AM Orson Slick, MD [facility] None  03/27/2022  3:15 PM Gabriel Carina, CNM [facility]  05/24/2022  2:00 PM Tobb, Godfrey Pick, DO [facility] None   Follow up Visit: Please schedule this patient for a In person postpartum visit in 6 weeks with the following provider: Any provider[facility]at [facility] Additional Postpartum F/U: none   Low risk pregnancy complicated by:  none Delivery mode:  Vaginal, Spontaneous  Anticipated Birth Control:  Unsure   03/25/2022 Lowry Ram, MD  Attestation of Supervision of Student:  I confirm that I have verified the information documented in the  resident 's note and that I have also personally reperformed the history, physical exam and all medical decision making activities.  I have verified that all services and findings are accurately documented in this student's note; and I agree with management and plan as outlined in the documentation. I have also made any necessary editorial changes.   Laury Deep, [place] for [employer], [place] [facility] 04/01/2022 4:03 PM

## 2022-03-24 NOTE — Lactation Note (Signed)
This note was copied from a baby's chart. Lactation Consultation Note  Patient Name: Michelle Guzman JOACZ'Y Date: 03/24/2022   Age:36 hours Per RN Beverlee Nims) in L&D, Birth Parent declined [facility] services in L&D and on MBU.  Maternal Data    Feeding    LATCH Score                    Lactation Tools Discussed/Used    Interventions    Discharge    Consult Status      [name] 03/24/2022, 1:28 AM

## 2022-03-24 NOTE — H&P (Addendum)
OBSTETRIC ADMISSION HISTORY AND PHYSICAL  Michelle Guzman is a 36 y.o. female [phone] with IUP at 72w4dby LMP presenting for SOL. She reports +FMs, No LOF, no VB, no blurry vision, headaches or peripheral edema, and RUQ pain.  She plans on breast feeding. She is undecided for birth control. [facility] --[facility]  Estimated Date of Delivery: 04/03/22  Sono:    @[redacted]w[redacted]d , CWD, normal anatomy, cephalic presentation, anterior lie, 640g, 79% EFW   Prenatal History/Complications:  Vitamin K deficiency coagulation disorder  AMA  H/o pretern delivery  H/o Pph requiring transfusion  Past Medical History: Past Medical History:  Diagnosis Date   Blood transfusion without reported diagnosis    Medical history non-contributory    Subchorionic hemorrhage in first trimester 08/30/2021   Vitamin K deficiency coagulation disorder ([place]     Past Surgical History: Past Surgical History:  Procedure Laterality Date   DILATION AND CURETTAGE OF UTERUS N/A 09/24/2019   Procedure: SUCTION DILATATION AND CURETTAGE;  Surgeon: PDonnamae Jude MD;  [place] [facility]  Service: Gynecology;  Laterality: N/A;   NO PAST SURGERIES      Obstetrical History: OB History     Gravida  6   Para  3   Term  2   Preterm  1   AB  3   Living  3      SAB  2   IAB  1   Ectopic      Multiple  0   Live Births  3           Social History Social History   Socioeconomic History   Marital status: Significant Other    Spouse name: Not on file   Number of children: Not on file   Years of education: Not on file   Highest education level: Not on file  Occupational History   Not on file  Tobacco Use   Smoking status: Never   Smokeless tobacco: Never  Vaping Use   Vaping Use: Never used  Substance and Sexual Activity   Alcohol use: Not Currently   Drug use: Not Currently    Types: Marijuana    Comment: stopped a couple weeks ago   Sexual  activity: Yes    Birth control/protection: None  Other Topics Concern   Not on file  Social History Narrative   Not on file   Social Determinants of Health   Financial Resource Strain: Not on file  Food Insecurity: No Food Insecurity (03/13/2022)   Hunger Vital Sign    Worried About Running Out of Food in the Last Year: Never true    Ran Out of Food in the Last Year: Never true  Transportation Needs: No Transportation Needs (03/13/2022)   PRAPARE - THydrologist(Medical): No    Lack of Transportation (Non-Medical): No  Physical Activity: Not on file  Stress: Not on file  Social Connections: Not on file    Family History: Family History  Problem Relation Age of Onset   Lupus Mother    Drug abuse Mother    Hypertension Mother    Mental illness Mother    Drug abuse Father    Heart disease Father     Allergies: Allergies  Allergen Reactions   Lactose Intolerance (Gi) Nausea And Vomiting and Other (See Comments)    headaches   Orange Juice [Orange Oil] Swelling  Medications Prior to Admission  Medication Sig Dispense Refill Last Dose   Blood Pressure Monitoring DEVI 1 each by Does not apply route once a week. 1 each 0    Misc. Devices (BREAST PUMP) MISC Dispense one breast pump for patient 1 each 0    Prenatal Vit-Fe Fumarate-FA (PRENATAL PO) Take by mouth.        Review of Systems   All systems reviewed and negative except as stated in HPI  Blood pressure 115/63, pulse (!) 52, temperature 98.3 F (36.8 C), temperature source Oral, resp. rate 17, last menstrual period 06/27/2021, unknown if currently breastfeeding. General appearance: alert and moderate distress In pain from contractions  Lungs: normal work of breathing on room air  Heart: regular rate on pulse ox, well perfused  Abdomen: soft, non-tender; bowel sounds normal Extremities: Homans sign is negative, no sign of DVT Presentation: cephalic Fetal monitoring Baseline 130,  moderate variability, + accels, early decels  Uterine activity contractions every minute- 2 mins Dilation: 10 Effacement (%): 100 Station: Plus 2 Exam by:: Marlou Porch, cnm   Prenatal labs: ABO, Rh: B/Positive/-- (04/13 1059) Antibody: Negative (04/13 1059) Rubella: 3.25 (04/13 1059) RPR: Non Reactive (07/26 0919)  HBsAg: Negative (04/13 1059)  HIV: Non Reactive (07/26 0919)  GBS: Negative/-- (10/04 1439)  1 hr Glucola 127 Genetic screening  Low risk female, silent carrier for alpha thalassemia  Anatomy US wnl   Prenatal Transfer Tool  Maternal Diabetes: No Genetic Screening: low risk female, silent carrier of alpha thal  Maternal Ultrasounds/Referrals: Normal Fetal Ultrasounds or other Referrals:  None Maternal Substance Abuse:  No Significant Maternal Medications:  None Significant Maternal Lab Results:  None Number of Prenatal Visits:greater than 3 verified prenatal visits Other Comments:  None  No results found for this or any previous visit (from the past 24 hour(s)).  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 [id number] at 23w4dhere for SOL.   #Labor:SOL, pushing on hands and knees, continue  #Pain: Unmedicated, support from FOB #FWB: Cat 1  #ID:  GBS unknown  #MOF: Breast #MOC:undecided   ALowry Ram MD  03/24/2022, 2:46 AM  I was present for the exam and agree with above. Vit K deficiency. Seen by Heme. No evidence Vit D Deficiency of other  coagulopathy. Per Heme, could have considered Kit K dose before delivery if there was suspicion for coagulopathy in labor but there was no concern and pt came in ant lip and pushing.    [place]10/22/2023 5:49 AM

## 2022-03-24 NOTE — Plan of Care (Signed)
  Problem: Education: Goal: Knowledge of Childbirth will improve Outcome: Adequate for Discharge Goal: Ability to make informed decisions regarding treatment and plan of care will improve Outcome: Adequate for Discharge Goal: Ability to state and carry out methods to decrease the pain will improve Outcome: Adequate for Discharge Goal: Individualized Educational Video(s) Outcome: Adequate for Discharge   Problem: Coping: Goal: Ability to verbalize concerns and feelings about labor and delivery will improve Outcome: Adequate for Discharge   Problem: Life Cycle: Goal: Ability to make normal progression through stages of labor will improve Outcome: Adequate for Discharge Goal: Ability to effectively push during vaginal delivery will improve Outcome: Adequate for Discharge   Problem: Role Relationship: Goal: Will demonstrate positive interactions with the child Outcome: Adequate for Discharge   Problem: Safety: Goal: Risk of complications during labor and delivery will decrease Outcome: Adequate for Discharge   Problem: Pain Management: Goal: Relief or control of pain from uterine contractions will improve Outcome: Adequate for Discharge   

## 2022-03-25 ENCOUNTER — Inpatient Hospital Stay: Payer: Medicaid Other | Admitting: Hematology and Oncology

## 2022-03-25 ENCOUNTER — Other Ambulatory Visit (HOSPITAL_COMMUNITY): Payer: Self-pay

## 2022-03-25 ENCOUNTER — Inpatient Hospital Stay: Payer: Medicaid Other

## 2022-03-25 MED ORDER — METHYLERGONOVINE MALEATE 0.2 MG PO TABS
0.2000 mg | ORAL_TABLET | ORAL | 0 refills | Status: DC | PRN
  Filled 2022-03-25: qty 12, 2d supply, fill #0

## 2022-03-25 NOTE — Lactation Note (Signed)
This note was copied from a baby's chart. Lactation Consultation Note  Patient Name: Michelle Guzman OHFGB'M Date: 03/25/2022 Reason for consult: Initial assessment Age:37 hours  P3, Mother's nipples are tender.  She is using personal nipple cream  Provided comfort gels. Discussed depth and suggest family call if they would like assistance with feeding. Reviewed engorgement care and monitoring voids/stools.   Feeding Mother's Current Feeding Choice: Breast Milk   Interventions Interventions: Education;Comfort gels  Status Consult Status: Complete    Carlye Grippe 03/25/2022, 1:17 PM

## 2022-03-27 ENCOUNTER — Encounter: Payer: Self-pay | Admitting: Certified Nurse Midwife

## 2022-03-28 ENCOUNTER — Telehealth: Payer: Self-pay

## 2022-03-28 NOTE — Telephone Encounter (Signed)
Called patient and advised her that her monitor results were normal per Dr. Harriet Masson. She gave a verbal understanding.

## 2022-04-04 ENCOUNTER — Telehealth (HOSPITAL_COMMUNITY): Payer: Self-pay | Admitting: *Deleted

## 2022-04-04 NOTE — Telephone Encounter (Signed)
Mom reports feeling good. No concerns about herself at this time. EPDS=3 [facility] score=3) Mom reports baby is doing well. Feeding, peeing, and pooping without difficulty. Safe sleep reviewed. Mom reports no concerns about baby at present.  Odis Hollingshead, RN 04-04-2022 at 4;00pm

## 2022-05-01 ENCOUNTER — Ambulatory Visit (INDEPENDENT_AMBULATORY_CARE_PROVIDER_SITE_OTHER): Payer: Medicaid Other | Admitting: Certified Nurse Midwife

## 2022-05-01 ENCOUNTER — Other Ambulatory Visit: Payer: Self-pay

## 2022-05-01 DIAGNOSIS — D684 Acquired coagulation factor deficiency: Secondary | ICD-10-CM

## 2022-05-01 DIAGNOSIS — O09523 Supervision of elderly multigravida, third trimester: Secondary | ICD-10-CM

## 2022-05-01 DIAGNOSIS — O0990 Supervision of high risk pregnancy, unspecified, unspecified trimester: Secondary | ICD-10-CM

## 2022-05-01 NOTE — Progress Notes (Signed)
Postpartum Visit Note  Michelle Guzman is a 36 y.o. [id number] female who presents for a postpartum visit. She is 5 weeks postpartum following a normal spontaneous vaginal delivery.  I have fully reviewed the prenatal and intrapartum course. The delivery was at [redacted]w[redacted]d gestational weeks.  Anesthesia: none. Postpartum course has been uncomplicated. Baby is doing well. Baby is feeding by breast. Bleeding staining only. Bowel function is normal. Bladder function is normal. Patient is sexually active. Contraception method is none. Postpartum depression screening: negative.   Upstream - 05/05/22 0735       Pregnancy Intention Screening   Does the patient want to become pregnant in the next year? No    Does the patient's partner want to become pregnant in the next year? No    Would the patient like to discuss contraceptive options today? No      Contraception Wrap Up   Current Method No Method - Other Reason    Reason for No Current Contraceptive Method at Intake ([facility] Only) Other    End Method Female Condom    Contraception Counseling Provided Yes    How was the end contraceptive method provided? N/A            The pregnancy intention screening data noted above was reviewed. Potential methods of contraception were discussed. The patient elected to proceed with Female Condom.   Edinburgh Postnatal Depression Scale - 05/01/22 1337       Edinburgh Postnatal Depression Scale:  In the Past 7 Days   I have been able to laugh and see the funny side of things. 0    I have looked forward with enjoyment to things. 0    I have blamed myself unnecessarily when things went wrong. 0    I have been anxious or worried for no good reason. 0    I have felt scared or panicky for no good reason. 0    Things have been getting on top of me. 0    I have been so unhappy that I have had difficulty sleeping. 0    I have felt sad or miserable. 0    I have been so unhappy that I have been crying. 0     The thought of harming myself has occurred to me. 0    Edinburgh Postnatal Depression Scale Total 0            Health Maintenance Due  Topic Date Due   INFLUENZA VACCINE  Never done   The following portions of the patient's history were reviewed and updated as appropriate: allergies, current medications, past family history, past medical history, past social history, past surgical history, and problem list.  Review of Systems Pertinent items noted in HPI and remainder of comprehensive ROS otherwise negative.  Objective:  BP 110/76   Pulse 73   Wt 125 lb (56.7 kg)   LMP 06/27/2021   Breastfeeding Yes   BMI 24.41 kg/m    Constitutional: Alert, oriented female in no physical distress.  HEENT: PERRLA Skin: normal color and turgor, no rash Cardiovascular: normal rate & rhythm, warm and well perfused Respiratory: normal effort, no problems with respiration noted GI: Abd soft, non-tender MS: Extremities nontender, no edema, normal ROM Neurologic: Alert and oriented x 4.  GU: no CVA tenderness Pelvic: perineum healing well per pt, exam deferred Breasts: normal lactating breasts  Assessment:  1. Postpartum care and examination  Plan:   Essential components of care per ACOG recommendations:  1.  Mood and well being: Patient with negative depression screening today. Reviewed local resources for support.  - Patient tobacco use? No.   - hx of drug use? No.    2. Infant care and feeding:  -Patient currently breastmilk feeding? Yes. Reviewed importance of draining breast regularly to support lactation.  -Social determinants of health (SDOH) reviewed in EPIC. No concerns  3. Sexuality, contraception and birth spacing - Patient does not want a pregnancy in the next year.  Desired family size is 4 children.  - Reviewed reproductive life planning. Reviewed contraceptive methods based on pt preferences and effectiveness.  Patient desired FAM or LAM, Female Condom, and Vasectomy  today.   - Discussed birth spacing of 18 months  4. Sleep and fatigue -Encouraged family/partner/community support of 4 hrs of uninterrupted sleep to help with mood and fatigue  5. Physical Recovery  - Discussed patients delivery and complications. She describes her labor as good. - Patient had a Vaginal, no problems at delivery. Patient had  no  laceration. Perineal healing reviewed. Patient expressed understanding - Patient has urinary incontinence? No. - Patient is safe to resume physical and sexual activity  6.  Health Maintenance - HM due items addressed Yes - Last pap smear  Diagnosis  Date Value Ref Range Status  09/13/2021   Final   - Negative for intraepithelial lesion or malignancy (NILM)   Pap smear not done at today's visit.  -Breast Cancer screening indicated? No.   7. Chronic Disease/Pregnancy Condition follow up: None - PCP follow up as needed  Bernerd Limbo, CNM [facility] [employer], [facility]

## 2022-05-24 ENCOUNTER — Ambulatory Visit: Payer: Medicaid Other | Admitting: Cardiology

## 2022-07-09 ENCOUNTER — Emergency Department (HOSPITAL_COMMUNITY)
Admission: EM | Admit: 2022-07-09 | Discharge: 2022-07-10 | Discharge status: Against Medical Advice | Payer: Medicaid Other | Attending: Student | Admitting: Student

## 2022-07-09 DIAGNOSIS — R112 Nausea with vomiting, unspecified: Secondary | ICD-10-CM | POA: Insufficient documentation

## 2022-07-09 DIAGNOSIS — R519 Headache, unspecified: Secondary | ICD-10-CM | POA: Diagnosis not present

## 2022-07-09 DIAGNOSIS — Z1152 Encounter for screening for COVID-19: Secondary | ICD-10-CM | POA: Diagnosis not present

## 2022-07-09 DIAGNOSIS — R197 Diarrhea, unspecified: Secondary | ICD-10-CM | POA: Diagnosis not present

## 2022-07-09 DIAGNOSIS — Z5321 Procedure and treatment not carried out due to patient leaving prior to being seen by health care provider: Secondary | ICD-10-CM | POA: Insufficient documentation

## 2022-07-09 DIAGNOSIS — R109 Unspecified abdominal pain: Secondary | ICD-10-CM | POA: Diagnosis not present

## 2022-07-09 DIAGNOSIS — M791 Myalgia, unspecified site: Secondary | ICD-10-CM | POA: Diagnosis not present

## 2022-07-09 LAB — CBC WITH DIFFERENTIAL/PLATELET
Abs Immature Granulocytes: 0.01 10*3/uL (ref 0.00–0.07)
Basophils Absolute: 0 10*3/uL (ref 0.0–0.1)
Basophils Relative: 0 %
Eosinophils Absolute: 0 10*3/uL (ref 0.0–0.5)
Eosinophils Relative: 0 %
HCT: 39.7 % (ref 36.0–46.0)
Hemoglobin: 13.3 g/dL (ref 12.0–15.0)
Immature Granulocytes: 0 %
Lymphocytes Relative: 14 %
Lymphs Abs: 0.8 10*3/uL (ref 0.7–4.0)
MCH: 27.1 pg (ref 26.0–34.0)
MCHC: 33.5 g/dL (ref 30.0–36.0)
MCV: 80.9 fL (ref 80.0–100.0)
Monocytes Absolute: 0.8 10*3/uL (ref 0.1–1.0)
Monocytes Relative: 14 %
Neutro Abs: 4 10*3/uL (ref 1.7–7.7)
Neutrophils Relative %: 72 %
Platelets: 236 10*3/uL (ref 150–400)
RBC: 4.91 MIL/uL (ref 3.87–5.11)
RDW: 13.2 % (ref 11.5–15.5)
WBC: 5.6 10*3/uL (ref 4.0–10.5)
nRBC: 0 % (ref 0.0–0.2)

## 2022-07-09 LAB — COMPREHENSIVE METABOLIC PANEL
ALT: 21 U/L (ref 0–44)
AST: 21 U/L (ref 15–41)
Albumin: 4.3 g/dL (ref 3.5–5.0)
Alkaline Phosphatase: 61 U/L (ref 38–126)
Anion gap: 17 — ABNORMAL HIGH (ref 5–15)
BUN: <5 mg/dL — ABNORMAL LOW (ref 6–20)
CO2: 19 mmol/L — ABNORMAL LOW (ref 22–32)
Calcium: 9.4 mg/dL (ref 8.9–10.3)
Chloride: 96 mmol/L — ABNORMAL LOW (ref 98–111)
Creatinine, Ser: 0.87 mg/dL (ref 0.44–1.00)
GFR, Estimated: >60 mL/min (ref >60)
Glucose, Bld: 115 mg/dL — ABNORMAL HIGH (ref 70–99)
Potassium: 2.9 mmol/L — ABNORMAL LOW (ref 3.5–5.1)
Sodium: 132 mmol/L — ABNORMAL LOW (ref 135–145)
Total Bilirubin: 1 mg/dL (ref 0.3–1.2)
Total Protein: 7.3 g/dL (ref 6.5–8.1)

## 2022-07-09 LAB — LIPASE, BLOOD: Lipase: 30 U/L (ref 11–51)

## 2022-07-09 LAB — RESP PANEL BY RT-PCR (RSV, FLU A&B, COVID)  RVPGX2
Influenza A by PCR: NEGATIVE
Influenza B by PCR: NEGATIVE
Resp Syncytial Virus by PCR: NEGATIVE
SARS Coronavirus 2 by RT PCR: NEGATIVE

## 2022-07-09 LAB — I-STAT BETA HCG BLOOD, ED (MC, WL, AP ONLY): I-stat hCG, quantitative: <5 m[IU]/mL (ref <5)

## 2022-07-09 MED ORDER — ONDANSETRON HCL 4 MG/2ML IJ SOLN: 4.0000 mg | Freq: Once | INTRAMUSCULAR | Status: DC

## 2022-07-09 MED ORDER — ONDANSETRON 4 MG PO TBDP
4.0000 mg | ORAL_TABLET | Freq: Once | ORAL | Status: AC
  Administered 2022-07-09: 4 mg via ORAL
  Filled 2022-07-09: qty 1

## 2022-07-09 NOTE — ED Triage Notes (Signed)
Pt coming from home, About two days ago pt started having this nauseous and vomiting and diarrhea along with a headache and generalized body aches. Abdominal pain 8/10 128/70 64 HR 102 CBG O2 99  Alert and oriented

## 2022-07-09 NOTE — ED Provider Triage Note (Signed)
Emergency Medicine Provider Triage Evaluation Note  Yulia Ulrich Armstrong-Hillsman , a 37 y.o. female  was evaluated in triage.  Pt complains of nausea vomiting diarrhea.  Started 2 days ago.  Endorses abdominal cramping but no focal tenderness.  States her baby vomiting diarrhea couple days prior.  Denies recent travel.  Not able to keep anything down.  Denies fever.  Review of Systems  Positive: See above Negative: See above  Physical Exam  BP (!) 175/87 (BP Location: Right Arm)   Pulse (!) 52   Temp 98.3 F (36.8 C) (Oral)   Resp 17   SpO2 100%  Gen:   Awake, no distress   Resp:  Normal effort  MSK:   Moves extremities without difficulty  Other:  Abdomen soft non tender  Medical Decision Making  Medically screening exam initiated at 6:13 PM.  Appropriate orders placed.  [employer] Armstrong-Hillsman was informed that the remainder of the evaluation will be completed by another provider, this initial triage assessment does not replace that evaluation, and the importance of remaining in the ED until their evaluation is complete.  Work up started   Harriet Pho, [place] 07/09/22 1814

## 2022-07-09 NOTE — ED Notes (Signed)
Pt stating that she is leaving d/t obligations at home. RN stated to pt to return if symptoms worsen. Pt's sister is coming to pick pt up.

## 2022-08-14 ENCOUNTER — Emergency Department (HOSPITAL_COMMUNITY): Payer: Medicaid Other

## 2022-08-14 ENCOUNTER — Other Ambulatory Visit: Payer: Self-pay

## 2022-08-14 ENCOUNTER — Observation Stay (HOSPITAL_COMMUNITY)
Admission: EM | Admit: 2022-08-14 | Discharge: 2022-08-15 | Disposition: A | Discharge status: Home / Self Care | Payer: Medicaid Other | Attending: Family Medicine | Admitting: Family Medicine

## 2022-08-14 DIAGNOSIS — R197 Diarrhea, unspecified: Secondary | ICD-10-CM | POA: Diagnosis not present

## 2022-08-14 DIAGNOSIS — R001 Bradycardia, unspecified: Secondary | ICD-10-CM | POA: Insufficient documentation

## 2022-08-14 DIAGNOSIS — Z1152 Encounter for screening for COVID-19: Secondary | ICD-10-CM | POA: Diagnosis not present

## 2022-08-14 DIAGNOSIS — R112 Nausea with vomiting, unspecified: Secondary | ICD-10-CM

## 2022-08-14 DIAGNOSIS — R531 Weakness: Secondary | ICD-10-CM | POA: Insufficient documentation

## 2022-08-14 DIAGNOSIS — K529 Noninfective gastroenteritis and colitis, unspecified: Secondary | ICD-10-CM | POA: Diagnosis not present

## 2022-08-14 DIAGNOSIS — F12929 Cannabis use, unspecified with intoxication, unspecified: Secondary | ICD-10-CM

## 2022-08-14 DIAGNOSIS — R4182 Altered mental status, unspecified: Principal | ICD-10-CM | POA: Diagnosis present

## 2022-08-14 LAB — I-STAT BETA HCG BLOOD, ED (MC, WL, AP ONLY): I-stat hCG, quantitative: <5 m[IU]/mL (ref <5)

## 2022-08-14 LAB — URINALYSIS, W/ REFLEX TO CULTURE (INFECTION SUSPECTED)
Bacteria, UA: NONE SEEN
Bilirubin Urine: NEGATIVE
Glucose, UA: NEGATIVE mg/dL
Hgb urine dipstick: NEGATIVE
Ketones, ur: 20 mg/dL — AB
Leukocytes,Ua: NEGATIVE
Nitrite: NEGATIVE
Protein, ur: NEGATIVE mg/dL
Specific Gravity, Urine: 1.013 (ref 1.005–1.030)
pH: 8 (ref 5.0–8.0)

## 2022-08-14 LAB — RAPID URINE DRUG SCREEN, HOSP PERFORMED
Amphetamines: NOT DETECTED
Barbiturates: NOT DETECTED
Benzodiazepines: NOT DETECTED
Cocaine: NOT DETECTED
Opiates: NOT DETECTED
Tetrahydrocannabinol: POSITIVE — AB

## 2022-08-14 LAB — COMPREHENSIVE METABOLIC PANEL
ALT: 16 U/L (ref 0–44)
AST: 19 U/L (ref 15–41)
Albumin: 4.1 g/dL (ref 3.5–5.0)
Alkaline Phosphatase: 55 U/L (ref 38–126)
Anion gap: 12 (ref 5–15)
BUN: <5 mg/dL — ABNORMAL LOW (ref 6–20)
CO2: 20 mmol/L — ABNORMAL LOW (ref 22–32)
Calcium: 8.8 mg/dL — ABNORMAL LOW (ref 8.9–10.3)
Chloride: 104 mmol/L (ref 98–111)
Creatinine, Ser: 0.63 mg/dL (ref 0.44–1.00)
GFR, Estimated: >60 mL/min (ref >60)
Glucose, Bld: 119 mg/dL — ABNORMAL HIGH (ref 70–99)
Potassium: 3.3 mmol/L — ABNORMAL LOW (ref 3.5–5.1)
Sodium: 136 mmol/L (ref 135–145)
Total Bilirubin: 0.6 mg/dL (ref 0.3–1.2)
Total Protein: 6.6 g/dL (ref 6.5–8.1)

## 2022-08-14 LAB — CBC WITH DIFFERENTIAL/PLATELET
Abs Immature Granulocytes: 0.05 10*3/uL (ref 0.00–0.07)
Basophils Absolute: 0 10*3/uL (ref 0.0–0.1)
Basophils Relative: 0 %
Eosinophils Absolute: 0 10*3/uL (ref 0.0–0.5)
Eosinophils Relative: 0 %
HCT: 35.5 % — ABNORMAL LOW (ref 36.0–46.0)
Hemoglobin: 11.5 g/dL — ABNORMAL LOW (ref 12.0–15.0)
Immature Granulocytes: 1 %
Lymphocytes Relative: 17 %
Lymphs Abs: 1 10*3/uL (ref 0.7–4.0)
MCH: 27.6 pg (ref 26.0–34.0)
MCHC: 32.4 g/dL (ref 30.0–36.0)
MCV: 85.3 fL (ref 80.0–100.0)
Monocytes Absolute: 0.4 10*3/uL (ref 0.1–1.0)
Monocytes Relative: 6 %
Neutro Abs: 4.3 10*3/uL (ref 1.7–7.7)
Neutrophils Relative %: 76 %
Platelets: 260 10*3/uL (ref 150–400)
RBC: 4.16 MIL/uL (ref 3.87–5.11)
RDW: 14.6 % (ref 11.5–15.5)
WBC: 5.7 10*3/uL (ref 4.0–10.5)
nRBC: 0 % (ref 0.0–0.2)

## 2022-08-14 LAB — I-STAT CHEM 8, ED
BUN: 4 mg/dL — ABNORMAL LOW (ref 6–20)
Calcium, Ion: 1.11 mmol/L — ABNORMAL LOW (ref 1.15–1.40)
Chloride: 104 mmol/L (ref 98–111)
Creatinine, Ser: 0.5 mg/dL (ref 0.44–1.00)
Glucose, Bld: 118 mg/dL — ABNORMAL HIGH (ref 70–99)
HCT: 37 % (ref 36.0–46.0)
Hemoglobin: 12.6 g/dL (ref 12.0–15.0)
Potassium: 3.3 mmol/L — ABNORMAL LOW (ref 3.5–5.1)
Sodium: 139 mmol/L (ref 135–145)
TCO2: 21 mmol/L — ABNORMAL LOW (ref 22–32)

## 2022-08-14 LAB — RESP PANEL BY RT-PCR (RSV, FLU A&B, COVID)  RVPGX2
Influenza A by PCR: NEGATIVE
Influenza B by PCR: NEGATIVE
Resp Syncytial Virus by PCR: NEGATIVE
SARS Coronavirus 2 by RT PCR: NEGATIVE

## 2022-08-14 LAB — MAGNESIUM: Magnesium: 1.7 mg/dL (ref 1.7–2.4)

## 2022-08-14 LAB — TROPONIN I (HIGH SENSITIVITY)
Troponin I (High Sensitivity): <2 ng/L (ref <18)
Troponin I (High Sensitivity): <2 ng/L (ref <18)

## 2022-08-14 LAB — T4, FREE: Free T4: 0.94 ng/dL (ref 0.61–1.12)

## 2022-08-14 LAB — TSH: TSH: 0.528 u[IU]/mL (ref 0.350–4.500)

## 2022-08-14 MED ORDER — POTASSIUM CHLORIDE CRYS ER 20 MEQ PO TBCR
40.0000 meq | EXTENDED_RELEASE_TABLET | Freq: Once | ORAL | Status: DC
  Filled 2022-08-14: qty 2

## 2022-08-14 MED ORDER — SODIUM CHLORIDE 0.9 % IV SOLN: Freq: Once | INTRAVENOUS | Status: AC

## 2022-08-14 MED ORDER — DIPHENHYDRAMINE HCL 50 MG/ML IJ SOLN
12.5000 mg | Freq: Once | INTRAMUSCULAR | Status: AC
  Administered 2022-08-14: 12.5 mg via INTRAVENOUS
  Filled 2022-08-14: qty 1

## 2022-08-14 MED ORDER — HYDRALAZINE HCL 20 MG/ML IJ SOLN
2.0000 mg | Freq: Once | INTRAMUSCULAR | Status: AC
  Administered 2022-08-14: 2 mg via INTRAVENOUS
  Filled 2022-08-14: qty 1

## 2022-08-14 MED ORDER — ONDANSETRON HCL 4 MG/2ML IJ SOLN
4.0000 mg | Freq: Four times a day (QID) | INTRAMUSCULAR | Status: DC | PRN
  Administered 2022-08-15 (×2): 4 mg via INTRAVENOUS
  Filled 2022-08-14 (×2): qty 2

## 2022-08-14 MED ORDER — ENOXAPARIN SODIUM 40 MG/0.4ML IJ SOSY
40.0000 mg | PREFILLED_SYRINGE | INTRAMUSCULAR | Status: DC
  Administered 2022-08-15: 40 mg via SUBCUTANEOUS
  Filled 2022-08-14: qty 0.4

## 2022-08-14 MED ORDER — LACTATED RINGERS IV BOLUS
1000.0000 mL | Freq: Once | INTRAVENOUS | Status: AC
  Administered 2022-08-14: 1000 mL via INTRAVENOUS

## 2022-08-14 MED ORDER — PROCHLORPERAZINE EDISYLATE 10 MG/2ML IJ SOLN
10.0000 mg | Freq: Once | INTRAMUSCULAR | Status: AC
  Administered 2022-08-14: 10 mg via INTRAVENOUS
  Filled 2022-08-14: qty 2

## 2022-08-14 MED ORDER — ONDANSETRON HCL 4 MG/2ML IJ SOLN
4.0000 mg | Freq: Once | INTRAMUSCULAR | Status: AC
  Administered 2022-08-14: 4 mg via INTRAVENOUS
  Filled 2022-08-14: qty 2

## 2022-08-14 MED ORDER — PROCHLORPERAZINE EDISYLATE 10 MG/2ML IJ SOLN
5.0000 mg | INTRAMUSCULAR | Status: AC
  Administered 2022-08-15: 5 mg via INTRAVENOUS
  Filled 2022-08-14: qty 2

## 2022-08-14 NOTE — ED Provider Notes (Addendum)
Patient signed out to me by previous provider. Please refer to their note for full HPI.  Briefly this is a 37 year old female who presented to the emergency department with weakness, nausea/vomiting/diarrhea.  Of concern patient was bradycardic prior to arrival, given atropine.  Since being here patient has had varying heart rates from 40s to 100.  Her blood pressure has remained stable the entire time. EKG is sinus, U waves but otherwise unremarkable. K slightly low, offered PO with fluid challenge. Mg normal. Patient continues to be generally weak, at times lethargic and "out of it".  Head CT is unremarkable.   Change in mental status does not seem to be heart rate dependent.  Heart rate is sinus.  Blood pressure has remained stable.  Do not feel that this is an acute cardiac event.    Patient has been hydrated, given medication, encouraged to eat.  Patient admits to eating a marijuana edible yesterday around 4 PM.  She claims that these edibles she has had before without any reaction.  She does not currently feel high.  Due to change in mental status I consulted with neurology.  We do not feel that this is infectious or meningitic in any way.  Do not feel further neuroimaging/LP is warranted at this time.  On my reevaluation patient does seem slightly improved but continues to be hard to keep awake or hold a long conversation with.  She admits that she feels slightly improved however significantly weak.  For this reason patient does not seem to be a safe discharge.  Will admit for further evaluation.  Added on further toxicology workup.  If not improved tomorrow, may consider repeat neurology consult.  Patients evaluation and results requires admission for further treatment and care.  Spoke with hospitalist , reviewed patient's ED course and they accept admission.  Patient agrees with admission plan, offers no new complaints and is stable/unchanged at time of admit.      Lorelle Gibbs,  DO 08/14/22 2123

## 2022-08-14 NOTE — Assessment & Plan Note (Addendum)
HR stable in 50s, asymptomatic. H/o bradycardia -Continuous cardiac monitoring

## 2022-08-14 NOTE — H&P (Cosign Needed Addendum)
Hospital Admission History and Physical Service Pager: [phone]  Patient name: Michelle Guzman Medical record number: [id number] Date of Birth: 20-Feb-1986 Age: 37 y.o. Gender: female  Primary Care Provider: Patient, No Pcp Per Consultants: None Code Status: Full code, discussed with patient Preferred Emergency Contact:  Contact Information     Name Relation Home Work Michelle Guzman, [place] Sister   [phone]        Chief Complaint: nausea and vomiting  Assessment and Plan: Michelle Guzman is a 37 y.o. female presenting with AMS in the setting of nausea and vomiting. Differential for this patient's presentation of this includes ingestion (marijuana), electrolyte abnormality, infection (gastroenteritis), central neurologic cause (seizure, stroke), cardiac cause (arrhythmia).  Patient's drowsiness most likely explained by marijuana ingestion in the setting of suspected likely viral gastroenteritis given history of diarrhea and vomiting causing consequent dehydration.  While possible, electrolyte derangement unlikely to be contributing given grossly normal in the emergency department outside of mild hypokalemia.  Central neurologic cause is unlikely given no focal deficits on exam and CT head negative.  There is some concern for arrhythmia causing hypoperfusion given patient's bradycardia and labile heart rate; however her blood pressures remain stable and EKG is reassuring for sinus rhythm.  * AMS (altered mental status) Likely in the setting of marijuana ingestion and viral gastroenteritis. CT head negative for acute intracranial process, glucose 119, UDS positive for THC, UA negative for infection. Patient drowsy but alert and oriented on exam. Evaluated by neurology in ED, do not believe primary neurologic insult at this time.  -Admit to family medicine teaching service, Med-Tele, attending Dr. Thompson Grayer -Reconsult neurology if patient mental status  does not improve overnight -Follow-up acetaminophen level, ethanol level -Hydration as below, replete electrolytes as needed -Vitals per floor  Gastroenteritis History of multiple episodes of diarrhea and nonbloody nonbilious vomiting most consistent with viral gastroenteritis given acute onset and lack of prior abdominal medical history.  Vital stable, mildly dehydrated on exam. -AM CMP, CBC -Replete electrolytes as needed -S/p 1 L LR bolus -Normal saline maintenance IVF -Follow-up GI PCR, C. difficile tests -Zofran '4mg'$  prn q6h  Sinus bradycardia As low as 30s with EMS. Strip at bedside to 42. Sinus bradycardia with U waves. Discussed patient's intermittent bradycardia, PVCs with cardiology fellow Dr. Foye Clock.  Does not feel that PVCs should be caused by marijuana intoxication. This is likely due to vagal response from vomiting, and PVCs possible in setting of hypokalemia/dehydration.  Echo normal June 2023, Zio patch without arrhythmia pregnancy.  Troponins negative. -Continuous cardiac monitoring   FEN/GI: NS mIVF, diet as tolerated VTE Prophylaxis: Lovenox  Disposition: Med-Tele  History of Present Illness:  Michelle Guzman is a 37 y.o. female presenting with nausea and vomiting.  Patient states she has had multiple episodes of diarrhea since 4 AM yesterday morning.  Reports they are nonbloody.  Denies fevers.  Had increasing nausea over the course of yesterday.  She then took a marijuana edible around 4 PM to try and help with her nausea.  This a.m. she has had multiple episodes of nonbloody nonbilious vomiting and associated weakness which caused her to call 911 and come to the emergency room.  She denies chest pain shortness of breath.  In EMS transport she was reportedly bradycardic to the 30s and received atropine with heart rate response.  Here in the ED vitals were notable for elevated blood pressures and intermittent bradycardia to high 40s.  Labs are  significant  for potassium of 3.3, bicarb 20, glucose 119, troponin less than 2, beta-hCG less than 5, UDS positive for THC.  CT head was negative for acute intracranial process.  Review Of Systems: Per HPI.  Pertinent Past Medical History: [id number] Palpitations during pregnancy.  Remainder reviewed in history tab.   Pertinent Past Surgical History: D&C Remainder reviewed in history tab.  Pertinent Social History: Tobacco use: No Alcohol use: No Other Substance use: Marijuana Lives with family  Pertinent Family History: Other-Lupus, drug use, hypertension Father-drug abuse, heart disease  Remainder reviewed in history tab.   Important Outpatient Medications: Prenatal vitamin Remainder reviewed in medication history.   Objective: BP (!) 170/88 (BP Location: Right Arm)   Pulse 76   Temp 98.5 F (36.9 C) (Oral)   Resp (!) 22   Ht 5' (1.524 m)   Wt 56 kg   SpO2 100%   BMI 24.11 kg/m  Exam: General: A&O, NAD, lying comfortably in hospital bed HEENT: No sign of trauma, EOM grossly intact, mildly dry mucous membranes Cardiac: Regular rate, irregular rhythm, no murmurs appreciated Respiratory: CTAB, normal WOB, no w/c/r GI: Soft, NTTP, non-distended, no rebound or guarding Extremities: NTTP, no peripheral edema, dorsalis pedis pulses 2+ bilaterally Neuro: Drowsy, moves all four extremities appropriately, pupils equal round and reactive    Labs:  CBC BMET  Recent Labs  Lab 08/14/22 1330 08/14/22 1340  WBC 5.7  --   HGB 11.5* 12.6  HCT 35.5* 37.0  PLT 260  --    Recent Labs  Lab 08/14/22 1330 08/14/22 1340  NA 136 139  K 3.3* 3.3*  CL 104 104  CO2 20*  --   BUN <5* 4*  CREATININE 0.63 0.50  GLUCOSE 119* 118*  CALCIUM 8.8*  --     Pertinent additional labs: UDS-THC positive UA-ketones Beta-hCG-less than 5 Troponin-less than 2 TSH-0.528 Free T4-0.94  EKG: My own interpretation: Sinus bradycardia, Qtc 377   Imaging Studies Performed:  CT head  w/o Contrast IMPRESSION: No evidence of an acute intracranial abnormality.   Salvadore Oxford, MD 08/14/2022, 10:45 PM PGY-1, [place] Intern pager: [phone], text pages welcome Secure chat group [place]     I have evaluated this patient along with Dr. Ruben Im and reviewed the above note, making necessary revisions.  Pearla Dubonnet, MD 08/14/2022, 10:46 PM PGY-2, [place]

## 2022-08-14 NOTE — ED Triage Notes (Signed)
Pt to the ed from home with a CC of weakness. Pt was found by ems to be extremely lethargic, sob, dizzy. Pt HR was in the 30s. Ems gave fluids and atropine, that brought the HR to the low 100s. Pt relay she still feel weak but is now oriented.

## 2022-08-14 NOTE — Assessment & Plan Note (Addendum)
Reports no further episodes of diarrhea and around 2 episodes of vomiting overnight. Some nausea. Reports she is keeping down some liquids. -Follow-up GI PCR, C. difficile tests -Zofran '4mg'$  prn q6h

## 2022-08-14 NOTE — Assessment & Plan Note (Signed)
Discussed patient's intermittent bradycardia, PVCs with cardiology fellow Dr. Foye Clock.  Does not feel that PVCs should be caused by marijuana intoxication. This is likely due to vagal response from vomiting, and PVCs possible in setting of dehydration.  Echo normal June 2023, Zio patch without arrhythmia pregnancy.  Troponins negative. -Continuous cardiac monitoring

## 2022-08-14 NOTE — ED Provider Notes (Signed)
[facility] Provider Note   CSN: [id number] Arrival date & time: 08/14/22  1253     History  Chief Complaint  Patient presents with   Bradycardia    Michelle Guzman is a 37 y.o. female.  HPI      37yo female with no significant medical history presents with concern for nausea, vomiting, diarrhea, generalized weakness.  Reports she was in normal state of health yesterday.  At 4AM developed nausea, vomiting, diarrhea. Had about 4 episodes of diarrhea, not black or bloody. Began to feel generally weak, severe fatigue, lightheaded. Took zofran but continued to have nausea.  Reports using marijuana yesterday but otherwise denies any ingestions or exposures.  Etoh last week. No other OTC or known ingestion/exposure.  Feels weak all over but denies numbness, focal weakness, difficulty talking, visual changes or facial droop.  No cough, abdominal pain, chest pain. Feels dyspnea with nausea but otherwise not short of breath.  No known sick contacts.  No hx of htn.  Reports having n/v about one month ago.  Had a baby end of October. No known medical problems.   EMS found her to be bradycardic to 30s. They gave fluid and atropine, thought she appeared more alert following this.    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 Authorizing Provider  Prenatal Vit-Fe Fumarate-FA (PRENATAL PO) Take 2 tablets by mouth daily.    [provider]      Allergies    Lactose intolerance (gi) and Orange juice [orange oil]    Review of Systems   Review of Systems  Physical Exam Updated Vital Signs BP (!) 160/95   Pulse 84   Temp 98.1 F (36.7 C)  (Oral)   Resp 18   Ht 5' (1.524 m)   Wt 56 kg   SpO2 100%   BMI 24.11 kg/m  Physical Exam Vitals and nursing note reviewed.  Constitutional:      General: She is not in acute distress.    Appearance: She is well-developed. She is ill-appearing. She is not diaphoretic.  HENT:     Head: Normocephalic and atraumatic.  Eyes:     Conjunctiva/sclera: Conjunctivae normal.  Cardiovascular:     Rate and Rhythm: Normal rate and regular rhythm.     Heart sounds: Normal heart sounds. No murmur heard.    No friction rub. No gallop.  Pulmonary:     Effort: Pulmonary effort is normal. No respiratory distress.     Breath sounds: Normal breath sounds. No wheezing or rales.  Abdominal:     General: There is no distension.     Palpations: Abdomen is soft.     Tenderness: There is no abdominal tenderness. There is no guarding.  Musculoskeletal:        General: No tenderness.     Cervical back: Normal range of motion.  Skin:    General: Skin is warm and dry.     Findings: No erythema or rash.  Neurological:     Mental Status: She is alert and oriented to person, place, and time.     Cranial Nerves: Cranial nerve deficit: symmetric smile, PERRL, normal sensation, midline tongue, normal palate raise.  Sensory: No sensory deficit.     Motor: Weakness: generalized.     ED Results / Procedures / Treatments   Labs (all labs ordered are listed, but only abnormal results are displayed) Labs Reviewed  CBC WITH DIFFERENTIAL/PLATELET - Abnormal; Notable for the following components:      Result Value   Hemoglobin 11.5 (*)    HCT 35.5 (*)    All other components within normal limits  COMPREHENSIVE METABOLIC PANEL - Abnormal; Notable for the following components:   Potassium 3.3 (*)    CO2 20 (*)    Glucose, Bld 119 (*)    BUN <5 (*)    Calcium 8.8 (*)    All other components within normal limits  RAPID URINE DRUG SCREEN, HOSP PERFORMED - Abnormal; Notable for the following components:    Tetrahydrocannabinol POSITIVE (*)    All other components within normal limits  I-STAT CHEM 8, ED - Abnormal; Notable for the following components:   Potassium 3.3 (*)    BUN 4 (*)    Glucose, Bld 118 (*)    Calcium, Ion 1.11 (*)    TCO2 21 (*)    All other components within normal limits  RESP PANEL BY RT-PCR (RSV, FLU A&B, COVID)  RVPGX2  GASTROINTESTINAL PANEL BY PCR, STOOL (REPLACES STOOL CULTURE)  C DIFFICILE QUICK SCREEN W PCR REFLEX    TSH  T4, FREE  ACETAMINOPHEN LEVEL  URINALYSIS, W/ REFLEX TO CULTURE (INFECTION SUSPECTED)  MAGNESIUM  I-STAT BETA HCG BLOOD, ED (MC, WL, AP ONLY)  TROPONIN I (HIGH SENSITIVITY)  TROPONIN I (HIGH SENSITIVITY)    EKG EKG Interpretation  Date/Time:  Wednesday August 14 2022 15:45:57 EDT Ventricular Rate:  44 PR Interval:  152 QRS Duration: 100 QT Interval:  469 QTC Calculation: 402 R Axis:   61 Text Interpretation: Sinus bradycardia Probable left atrial enlargement Nonspecific T abnrm, anterolateral leads ST elev, probable normal early repol pattern Confirmed by Lavenia Atlas [phone]) on 08/14/2022 4:39:18 PM  Radiology CT Head Wo Contrast  Result Date: 08/14/2022 CLINICAL DATA:  Provided history: Headache, sudden, severe. EXAM: CT HEAD WITHOUT CONTRAST TECHNIQUE: Contiguous axial images were obtained from the base of the skull through the vertex without intravenous contrast. RADIATION DOSE REDUCTION: This exam was performed according to the departmental dose-optimization program which includes automated exposure control, adjustment of the mA and/or kV according to patient size and/or use of iterative reconstruction technique. COMPARISON:  No pertinent prior exams available for comparison. FINDINGS: Brain: Cerebral volume is normal. There is no acute intracranial hemorrhage. No demarcated cortical infarct. No extra-axial fluid collection. No evidence of an intracranial mass. No midline shift. Vascular: No hyperdense vessel. Skull: No fracture or  aggressive osseous lesion. Sinuses/Orbits: No mass or acute finding within the imaged orbits. No significant paranasal sinus disease at the imaged levels. IMPRESSION: No evidence of an acute intracranial abnormality. Electronically Signed   By: Kellie Simmering D.O.   On: 08/14/2022 14:04   DG Chest Portable 1 View  Result Date: 08/14/2022 CLINICAL DATA:  Shortness of breath EXAM: PORTABLE CHEST 1 VIEW COMPARISON:  None Available. FINDINGS: No pleural effusion. No pneumothorax. No focal airspace opacity. Normal cardiac and mediastinal contours. No radiographically apparent displaced rib fracture. Nonspecific calcific density along the right hilum, which could represent a calcified right hilar lymph node. IMPRESSION: 1. No focal airspace opacity 2. Nonspecific calcific density along the right hilum, possibly a calcified right hilar lymph node. Electronically Signed   By: Marin Olp.D.  On: 08/14/2022 13:46    Procedures Procedures    Medications Ordered in ED Medications  potassium chloride SA (KLOR-CON M) CR tablet 40 mEq (has no administration in time range)  lactated ringers bolus 1,000 mL (1,000 mLs Intravenous New Bag/Given 08/14/22 1333)  ondansetron (ZOFRAN) injection 4 mg (4 mg Intravenous Given 08/14/22 1334)  prochlorperazine (COMPAZINE) injection 10 mg (10 mg Intravenous Given 08/14/22 1549)  diphenhydrAMINE (BENADRYL) injection 12.5 mg (12.5 mg Intravenous Given 08/14/22 1548)  hydrALAZINE (APRESOLINE) injection 2 mg (2 mg Intravenous Given 08/14/22 1548)    ED Course/ Medical Decision Making/ A&P                              37yo female with no significant medical history (vaginal delivery 03/24/2022) presents with concern for nausea, vomiting, diarrhea, generalized weakness, found to have sinus bradycardia to the 30s with EMS.   Received atropine with EMS, no HR in 80s.   DDx includes gastroenteritis, ectopic pregnancy, cardiac etiology/arrhythmia, DKA, other toxic/metabolic  etiology, ICH, UTI, intraabdominal etiology, hypertensive urgency.  Regarding bradycardia: rhythm appears sinus, no sign of hyperkalemia, no hypotension or history to suggest organophosphate poisoning, normal TSH/free T4, no ICH, troponin normal doubt myocarditis, no hypotension to suggest other spinal etiology.  Consider other medication/drug induced bradycardia or increased vagal tone in setting of vomiting.  Regarding nausea/vomiting: CT head negative, pregnancy test negative. No significant electrolyte abnormalities. Abdominal exam benign.   CXR completed and personally about and reviewed by me and radiology and shows no focal airspace opacity, normal cardiac and mediastinal contours for a portable film.  Does show a nonspecific calcific density which may be calcified right hilar lymph node, recommend follow-up with PCP. Do not suspect pneumonia, PE, dissection at this time by hx and exam.  Labs completed and personally evaluated and interpreted by me show normal troponin, normal TSH and free T4, no clinically significant electrolyte abnormality.  Mild hypokalemia, will plan on replacement.  Bicarb mildly low, likely in the setting of vomiting and diarrhea.  CBC with decreased hemoglobin from 1 month ago from 13.3-11.5.  Pregnancy test negative.  UDS positive for THC.   Will continue symptomatic management and monitoring.  Care transition to Dr. Fulton Reek at 3:15 PM.          Final Clinical Impression(s) / ED Diagnoses Final diagnoses:  Sinus bradycardia  Generalized weakness  Nausea vomiting and diarrhea    Rx / DC Orders ED Discharge Orders     None         Gareth Morgan, MD 08/14/22 1701

## 2022-08-14 NOTE — ED Notes (Signed)
Shift report received, assumed care of patient at this time.  

## 2022-08-14 NOTE — ED Notes (Addendum)
Pt report received from previous nurse. Pt resting, rise/fall of chest noted, vitals stable. Call bell in reach. No acute distress noted.

## 2022-08-14 NOTE — Assessment & Plan Note (Addendum)
Patient AOx4 and actively engaging in conversation. Much improved and AMS likely secondary to dehydration and Benadryl ingestion. -Follow-up acetaminophen level, ethanol level -S/p MIVF and 1L LR bolus

## 2022-08-15 ENCOUNTER — Other Ambulatory Visit (HOSPITAL_COMMUNITY): Payer: Self-pay

## 2022-08-15 LAB — COMPREHENSIVE METABOLIC PANEL
ALT: 16 U/L (ref 0–44)
AST: 22 U/L (ref 15–41)
Albumin: 4 g/dL (ref 3.5–5.0)
Alkaline Phosphatase: 60 U/L (ref 38–126)
Anion gap: 15 (ref 5–15)
BUN: <5 mg/dL — ABNORMAL LOW (ref 6–20)
CO2: 21 mmol/L — ABNORMAL LOW (ref 22–32)
Calcium: 9.1 mg/dL (ref 8.9–10.3)
Chloride: 95 mmol/L — ABNORMAL LOW (ref 98–111)
Creatinine, Ser: 0.59 mg/dL (ref 0.44–1.00)
GFR, Estimated: >60 mL/min (ref >60)
Glucose, Bld: 117 mg/dL — ABNORMAL HIGH (ref 70–99)
Potassium: 3.3 mmol/L — ABNORMAL LOW (ref 3.5–5.1)
Sodium: 131 mmol/L — ABNORMAL LOW (ref 135–145)
Total Bilirubin: 1 mg/dL (ref 0.3–1.2)
Total Protein: 6.9 g/dL (ref 6.5–8.1)

## 2022-08-15 LAB — CBC
HCT: 38.5 % (ref 36.0–46.0)
Hemoglobin: 12.4 g/dL (ref 12.0–15.0)
MCH: 27.4 pg (ref 26.0–34.0)
MCHC: 32.2 g/dL (ref 30.0–36.0)
MCV: 85 fL (ref 80.0–100.0)
Platelets: 337 10*3/uL (ref 150–400)
RBC: 4.53 MIL/uL (ref 3.87–5.11)
RDW: 14.4 % (ref 11.5–15.5)
WBC: 9.9 10*3/uL (ref 4.0–10.5)
nRBC: 0 % (ref 0.0–0.2)

## 2022-08-15 MED ORDER — ONDANSETRON 4 MG PO TBDP
4.0000 mg | ORAL_TABLET | Freq: Three times a day (TID) | ORAL | 0 refills | Status: AC | PRN
  Filled 2022-08-15: qty 20, 7d supply, fill #0

## 2022-08-15 NOTE — ED Notes (Signed)
MD at bedside. 

## 2022-08-15 NOTE — ED Notes (Signed)
Pt medicated for nausea, no other acute distress noted at this time. Call bell in reach. VS stable

## 2022-08-15 NOTE — Progress Notes (Addendum)
     Daily Progress Note Intern Pager: [phone]  Patient name: Michelle Guzman Medical record number: [id number] Date of birth: 01-05-1986 Age: 37 y.o. Gender: female  Primary Care Provider: Patient, No Pcp Per Consultants: None Code Status: Full code  Pt Overview and Major Events to Date:  3/13: Admitted to FMTS  Assessment and Plan: Michelle Guzman is a 37 y.o. female presenting with AMS in the setting of nausea and vomiting.  PMHx includes palpitations during pregnancy.  * AMS (altered mental status) Patient AOx4 and actively engaging in conversation. Much improved and AMS likely secondary to dehydration and Benadryl ingestion. -Follow-up acetaminophen level, ethanol level -S/p MIVF and 1L LR bolus  Gastroenteritis Reports no further episodes of diarrhea and around 2 episodes of vomiting overnight. Some nausea. Reports she is keeping down some liquids. -Follow-up GI PCR, C. difficile tests -Zofran 4mg  prn q6h  Sinus bradycardia HR stable in 50s, asymptomatic. H/o bradycardia -Continuous cardiac monitoring   FEN/GI: Regular PPx: Lovenox Dispo: Home pending clinical improvement  Subjective:  Patient assessed at bedside, resting comfortably. States  Objective: Temp:  [98.1 F (36.7 C)-98.9 F (37.2 C)] 98.9 F (37.2 C) (03/14 0725) Pulse Rate:  [39-95] 50 (03/14 0725) Resp:  [15-31] 20 (03/14 0725) BP: (148-186)/(78-135) 156/84 (03/14 0725) SpO2:  [97 %-100 %] 100 % (03/14 0725) Weight:  [56 kg] 56 kg (03/13 1312) Physical Exam: General: Resting in bed, alert, NAD Cardiovascular: Bradycardia. Regular rhythm. No murmur Respiratory: CTAB. Normal WOB on RA Abdomen: Soft, Mildly tender to palpation over epigastric and LUQ Extremities: No peripheral edema  Laboratory: Most recent CBC Lab Results  Component Value Date   WBC 9.9 08/15/2022   HGB 12.4 08/15/2022   HCT 38.5 08/15/2022   MCV 85.0 08/15/2022   PLT 337 08/15/2022    Most recent BMP    Latest Ref Rng & Units 08/15/2022    5:50 AM  BMP  Glucose 70 - 99 mg/dL 117   BUN 6 - 20 mg/dL <5   Creatinine 0.44 - 1.00 mg/dL 0.59   Sodium 135 - 145 mmol/L 131   Potassium 3.5 - 5.1 mmol/L 3.3   Chloride 98 - 111 mmol/L 95   CO2 22 - 32 mmol/L 21   Calcium 8.9 - 10.3 mg/dL 9.1     Other pertinent labs: Mag: 1.7 Troponin <2 Glu: 117 THC+  Imaging/Diagnostic Tests: CT Head Wo Contrast Result Date: 08/14/2022 IMPRESSION: No evidence of an acute intracranial abnormality.   DG Chest Portable 1 View Result Date: 08/14/2022 IMPRESSION: 1. No focal airspace opacity 2. Nonspecific calcific density along the right hilum, possibly a calcified right hilar lymph node.     Michelle [place], Michelle Guzman 08/15/2022, 9:33 AM  PGY-1, [place] Intern pager: [phone], text pages welcome Secure chat group [place]

## 2022-08-15 NOTE — Hospital Course (Addendum)
Michelle Guzman is a 37 y.o.female with a history of palpitations during pregnancy who was admitted to the [facility] Medicine Teaching Service at [facility] for N/V. Her hospital course is detailed below:  AMS Gastroenteritis Sinus bradycardia Presents with persistent vomiting and diarrhea x 1 day, unable to hold down any liquids. Initially sleepy but Aox4, ED consulted neuro who did not feel this was a primary neurologic concern. Likely secondary to dehydration, Benadryl and marijuana ingestion. HR in 30s in EMS, give Atropine with responsiveness. Cards consulted and suggested bradycardia likely result of vagal response from vomiting. She received 1L LR bolus and started on MIVF for rehydration. At the time of discharge patient symptoms significantly improved and she was tolerating PO.   PCP Follow-up Recommendations:  BP check, hypertensive throughout hospital stay F/u bradycardia outpatient with cardiology

## 2022-08-15 NOTE — Discharge Summary (Signed)
[facility] Discharge Summary  Patient name: Michelle Guzman Medical record number: [id number] Date of birth: [birth year] Age: 37 y.o. Gender: female Date of Admission: 08/14/2022  Date of Discharge: 08/15/2022 Admitting Physician: Salvadore Oxford, MD  Primary Care Provider: Patient, No Pcp Per Consultants: None  Indication for Hospitalization: AMS  Discharge Diagnoses/Problem List:  Principal Problem for Admission: AMS Other Problems addressed during stay:  Principal Problem:   AMS (altered mental status) Active Problems:   Gastroenteritis   Sinus bradycardia   Generalized weakness   Nausea vomiting and diarrhea  Brief Hospital Course:  Michelle Guzman is a 37 y.o.female with a history of palpitations during pregnancy who was admitted to the [facility] Medicine Teaching Service at [facility] for N/V. Her hospital course is detailed below:  AMS Gastroenteritis Sinus bradycardia Presents with persistent vomiting and diarrhea x 1 day, unable to hold down any liquids. Initially sleepy but Aox4, ED consulted neuro who did not feel this was a primary neurologic concern. Likely secondary to dehydration, Benadryl and marijuana ingestion. HR in 30s in EMS, give Atropine with responsiveness. Cards consulted and suggested bradycardia likely result of vagal response from vomiting. She received 1L LR bolus and started on MIVF for rehydration. At the time of discharge patient symptoms significantly improved and she was tolerating PO.   PCP Follow-up Recommendations:  BP check, hypertensive throughout hospital stay F/u bradycardia outpatient with cardiology  Disposition: Home  Discharge Condition: Stable  Discharge Exam:  Vitals:   08/15/22 1000 08/15/22 1100  BP: (!) 174/91 (!) 172/86  Pulse: (!) 50 (!) 50  Resp: (!) 21 (!) 26  Temp:  98.9 F (37.2 C)  SpO2: 100% 99%   General: Resting in bed, alert, NAD Cardiovascular:  Bradycardia. Regular rhythm. No murmur Respiratory: CTAB. Normal WOB on RA Abdomen: Soft, Mildly tender to palpation over epigastric and LUQ, non-distended. +BS Extremities: No peripheral edema  Significant Procedures: None  Significant Labs and Imaging:  Recent Labs  Lab 08/14/22 1330 08/14/22 1340 08/15/22 0550  WBC 5.7  --  9.9  HGB 11.5* 12.6 12.4  HCT 35.5* 37.0 38.5  PLT 260  --  337   Recent Labs  Lab 08/14/22 1330 08/14/22 1340 08/14/22 1531 08/15/22 0550  NA 136 139  --  131*  K 3.3* 3.3*  --  3.3*  CL 104 104  --  95*  CO2 20*  --   --  21*  GLUCOSE 119* 118*  --  117*  BUN <5* 4*  --  <5*  CREATININE 0.63 0.50  --  0.59  CALCIUM 8.8*  --   --  9.1  MG  --   --  1.7  --   ALKPHOS 55  --   --  60  AST 19  --   --  22  ALT 16  --   --  16  ALBUMIN 4.1  --   --  4.0    Pertinent Imaging: CT Head Wo Contrast Result Date: 08/14/2022 IMPRESSION: No evidence of an acute intracranial abnormality.   DG Chest Portable 1 View Result Date: 08/14/2022 IMPRESSION: 1. No focal airspace opacity 2. Nonspecific calcific density along the right hilum, possibly a calcified right hilar lymph node.  Results/Tests Pending at Time of Discharge: GI pathogen panel and C diff testing  Discharge Medications:  Allergies as of 08/15/2022       Reactions   Lactose Intolerance (gi) Nausea And Vomiting, Swelling, Other (See Comments)  headaches   Orange Juice [orange Oil] Swelling        Medication List     TAKE these medications    ondansetron 4 MG disintegrating tablet Commonly known as: ZOFRAN-ODT Take 1 tablet (4 mg total) by mouth every 8 (eight) hours as needed for nausea or vomiting.   PRENATAL PO Take 2 tablets by mouth daily.        Discharge Instructions: Please refer to Patient Instructions section of EMR for full details.  Patient was counseled important signs and symptoms that should prompt return to medical care, changes in medications, dietary  instructions, activity restrictions, and follow up appointments.   Follow-Up Appointments:  Follow-up Information     Colletta Maryland, MD Follow up on 08/22/2022.   Specialty: Family Medicine Why: appt at 2:10pm. Please arrive 15 min early Contact information: [place] [address] [phone]                 Colletta Maryland, MD 08/15/2022, 12:43 PM PGY-1, [place]

## 2022-08-15 NOTE — Discharge Instructions (Signed)
Dear Michelle Guzman,   Thank you for letting us participate in your care! In this section, you will find a brief hospital admission summary of why you were admitted to the hospital, what happened during your admission, your diagnosis/diagnoses, and recommended follow up.  Primary diagnosis: Viral gastroenteritis Treatment plan: We gave you fluids and electrolytes for rehydration in the setting of your vomiting and diarrhea. Secondary diagnosis: Bradycardia Treatment plan: You had a low heart rate and were given a medication by EMS to bring it up. Cardiology was consulted and stated your heart rate was likely due to a vagal response from vomiting and dehydration.  POST-HOSPITAL & CARE INSTRUCTIONS We recommend following up with your PCP within 1 week from being discharged from the hospital. Please let PCP/Specialists know of any changes in medications that were made which you will be able to see in the medications section of this packet.  DOCTOR'S APPOINTMENTS & FOLLOW UP No future appointments.   Thank you for choosing [facility]! Take care and be well!  [facility]  [place], [place] [address] [phone]

## 2022-08-20 NOTE — Progress Notes (Unsigned)
    SUBJECTIVE:   CHIEF COMPLAINT / HPI:   Hospital follow up: Seen at [facility] for N/V/D in the setting of like viral illness and dehydration. Since discharge states her symptoms have resolved. Unsure why she felt so bad but thinks it may have been a combination of illness and a Delta-9 drink she had that day. Hypertensive during hospital stay but states he BP is normally on the lower end. PCP Follow-up Recommendations:  BP check, hypertensive throughout hospital stay F/u bradycardia outpatient with cardiology   Postpartum state Patient relates she has some stressors in her life that have made doing tasks difficult. States her Middle school aged daughter is working through some recent mental health diagnosis and she is 5 months postpartum caring for a baby. States she is not completing tasks like she normally does but states she has many reminder lists. Denies depression. States she has anxiety at times but has some coping mechanisms for it. Has considered medication given it has been persistent over time.   PERTINENT  PMH / PSH: Palpitations in pregnancy  OBJECTIVE:   BP 116/71   Pulse 88   Ht 5\' 1"  (1.549 m)   Wt 113 lb (51.3 kg)   SpO2 100%   BMI 21.35 kg/m    General: NAD, pleasant, able to participate in exam Respiratory: Normal WOB on RA Extremities: no edema  Skin: warm and dry, no rashes noted Psych: Normal affect and mood  ASSESSMENT/PLAN:   Generalized weakness Hospitalized from 3/13/-3/14 for N/V/D and weakness likely secondary to viral illness and dehydration. Complete resolution of symptoms. Elevated BP in the ED, normotensive today. Previously bradycardic, normal pulse and no further episodes reported.  Postpartum state Gave birth 5 months ago and relates increased stress handling home life, caring for her older children (59 and 32) and newborn. Difficulty completing tasks and some anxiety. Denies depression. Given therapy resources. Consider medication as needed  if symptoms not improving with therapy.   Dr. Colletta Maryland, [place]

## 2022-08-22 ENCOUNTER — Ambulatory Visit: Payer: Medicaid Other | Admitting: Family Medicine

## 2022-08-22 ENCOUNTER — Other Ambulatory Visit: Payer: Self-pay

## 2022-08-22 VITALS — BP 116/71 | HR 88 | Ht 61.0 in | Wt 113.0 lb

## 2022-08-22 DIAGNOSIS — Z09 Encounter for follow-up examination after completed treatment for conditions other than malignant neoplasm: Secondary | ICD-10-CM

## 2022-08-22 DIAGNOSIS — R531 Weakness: Secondary | ICD-10-CM | POA: Diagnosis not present

## 2022-08-22 NOTE — Patient Instructions (Signed)
It was wonderful to see you today! Thank you for choosing [facility].   Please bring ALL of your medications with you to every visit.   Today we talked about:  I am including a list of therapy resources below. Please feel free to find one that suits you the best as some are online and some are in person. If you have any concens or want to discuss medication further then please feel free to come back and see me.  Please follow up as needed to establish with PCP  If you haven't already, sign up for My Chart to have easy access to your labs results, and communication with your primary care physician.   Call the clinic at [phone] if your symptoms worsen or you have any concerns.  Please be sure to schedule follow up at the front desk before you leave today.   Colletta Maryland, DO Family Medicine      Psychiatry Resource List (Adults and Children) Most of these providers will take Medicaid. please consult your insurance for a complete and updated list of available providers. When calling to make an appointment have your insurance information available to confirm you are covered.   [facility]:Psychiatry and Counseling [address] [facility] [address] [address]. [place], [place] [address] [phone]  [facility]  [place], [place]:   [facility]: [address]     [phone]   Linna Hoff: [place]. [place],        [phone] [place]: [place],    [place]: [phone] [address],                   [phone] Children: [place] [address]         [phone]  [place] (virtual only) [phone]   [facility] [place]  (Psychiatry only; Adults /children 12 and over, will take Medicaid)  [place], [place], [place] [address]        [phone]   [place] (Psychiatry & counseling ; adults & children ; will take Medicaid [address]  [address]  [place] [address]  Go on-line to complete referral ( [email or web address] [phone]    (Spanish speaking therapists)  [facility]  Psychiatry & counseling; Adults and children;  Call Registration prior to scheduling an appointment [phone]    [place], [place] [address]    [phone]  [facility] (Psychiatry & counseling; adults & children; Medicare no Medicaid)  [place],   [address]      [phone]    [facility] (up to age 100)  Psychiatry & counseling ,will take Medicaid, must do counseling to receive psychiatry services  [address] [place] [address]        ([place] (Psychiatry & counseling; adults & children; will take Medicaid) Will need a referral from provider [address],  [place], [place]  [phone]   [facility] --- Walk-In Mon-Friday 8am-3pm ( will take Medicaid, Psychiatry, Adults & children,  [address], [place], [place]   [phone]   [facility]--, Walk-in M-F 8am-12pm and 1pm -3pm   (Counseling, Psychiatry, will take Medicaid, adults & children)  [address] [place], [place]  [phone]

## 2022-08-22 NOTE — Assessment & Plan Note (Signed)
Gave birth 5 months ago and relates increased stress handling home life, caring for her older children (16 and 13) and newborn. Difficulty completing tasks and some anxiety. Denies depression. Given therapy resources. Consider medication as needed if symptoms not improving with therapy.

## 2022-08-22 NOTE — Assessment & Plan Note (Signed)
Hospitalized from 3/13/-3/14 for N/V/D and weakness likely secondary to viral illness and dehydration. Complete resolution of symptoms. Elevated BP in the ED, normotensive today. Previously bradycardic, normal pulse and no further episodes reported.

## 2024-01-09 ENCOUNTER — Emergency Department (HOSPITAL_COMMUNITY)
Admission: EM | Admit: 2024-01-09 | Discharge: 2024-01-09 | Disposition: A | Discharge status: Home / Self Care | Attending: Emergency Medicine | Admitting: Emergency Medicine

## 2024-01-09 ENCOUNTER — Emergency Department (HOSPITAL_COMMUNITY)

## 2024-01-09 ENCOUNTER — Other Ambulatory Visit: Payer: Self-pay

## 2024-01-09 ENCOUNTER — Encounter (HOSPITAL_COMMUNITY): Payer: Self-pay

## 2024-01-09 DIAGNOSIS — R071 Chest pain on breathing: Secondary | ICD-10-CM | POA: Diagnosis not present

## 2024-01-09 DIAGNOSIS — R072 Precordial pain: Secondary | ICD-10-CM | POA: Diagnosis present

## 2024-01-09 LAB — BASIC METABOLIC PANEL WITH GFR
Anion gap: 11 (ref 5–15)
BUN: 9 mg/dL (ref 6–20)
CO2: 22 mmol/L (ref 22–32)
Calcium: 9.1 mg/dL (ref 8.9–10.3)
Chloride: 105 mmol/L (ref 98–111)
Creatinine, Ser: 0.7 mg/dL (ref 0.44–1.00)
GFR, Estimated: >60 mL/min (ref >60)
Glucose, Bld: 110 mg/dL — ABNORMAL HIGH (ref 70–99)
Potassium: 4 mmol/L (ref 3.5–5.1)
Sodium: 138 mmol/L (ref 135–145)

## 2024-01-09 LAB — CBC
HCT: 37.3 % (ref 36.0–46.0)
Hemoglobin: 11.9 g/dL — ABNORMAL LOW (ref 12.0–15.0)
MCH: 27.7 pg (ref 26.0–34.0)
MCHC: 31.9 g/dL (ref 30.0–36.0)
MCV: 86.9 fL (ref 80.0–100.0)
Platelets: 254 K/uL (ref 150–400)
RBC: 4.29 MIL/uL (ref 3.87–5.11)
RDW: 13.1 % (ref 11.5–15.5)
WBC: 4.3 K/uL (ref 4.0–10.5)
nRBC: 0 % (ref 0.0–0.2)

## 2024-01-09 LAB — HCG, SERUM, QUALITATIVE: Preg, Serum: NEGATIVE

## 2024-01-09 LAB — TROPONIN I (HIGH SENSITIVITY)
Troponin I (High Sensitivity): <2 ng/L (ref <18)
Troponin I (High Sensitivity): <2 ng/L (ref <18)

## 2024-01-09 LAB — D-DIMER, QUANTITATIVE: D-Dimer, Quant: <0.27 ug{FEU}/mL (ref 0.00–0.50)

## 2024-01-09 MED ORDER — HYDROXYZINE HCL 25 MG PO TABS: 25.0000 mg | ORAL_TABLET | Freq: Four times a day (QID) | ORAL | 0 refills | Status: AC | PRN

## 2024-01-09 NOTE — ED Triage Notes (Signed)
 Pt bib pov c/o cp that has been ongoing for three days. Pt states when she takes a deep breath she feels discomfort. Pt says it is disturbing her sleep and not sure what is going.   Pt states she struggles with anxiety.   Pt denies excessive caffeine use, no hx clots, bc pills, and no long travel.

## 2024-01-09 NOTE — Discharge Instructions (Addendum)
 You were seen for your chest pain in the emergency department.  It is likely from pleurisy.  At home, please take Tylenol  and ibuprofen  for your pain.    Check your MyChart online for the results of any tests that had not resulted by the time you left the emergency department.   Follow-up with your primary doctor in 2-3 days regarding your visit.    Return immediately to the emergency department if you experience any of the following: Worsening pain, difficulty breathing, or any other concerning symptoms.    Thank you for visiting our Emergency Department. It was a pleasure taking care of you today.

## 2024-01-09 NOTE — ED Provider Notes (Signed)
 [place] EMERGENCY DEPARTMENT AT [facility] Provider Note  MDM   HPI/ROS:  Michelle Guzman is a 38 y.o. female with a medical history as below who presents with nonexertional, nonradiating substernal chest pain.  Her pain is constant and has been going on approximately 1 week and is unchanged in intensity.  She does state that she has difficulty taking a deep breath that does make her pain feel worse.  Otherwise she states she feels her pain most at night but is not Appeel to identify any other exacerbating or alleviating factors.  She does report that she has anxiety and has been self-medicating with marijuana for some time however quit marijuana approximately 1 week ago.  She denies any recent infectious symptoms, cough, fevers, diarrhea, dysuria.  She does endorse some intermittent lower extremity swelling bilaterally most commonly when she is on her feet for longer periods of time.  Denies any unilateral swelling or pain behind the calfs or in the thigh.  Denies any hormonal birth control.  Denies history of blood clots.  Physical exam is notable for: - Generally well-appearing, hemodynamically intact.  Clear lung sounds bilaterally with no murmurs rubs or gallops.  No leg swelling or tenderness.  On my initial evaluation, patient is:  -Vital signs stable. Patient afebrile, hemodynamically stable, and non-toxic appearing. -Additional history obtained from chart review   Differentials include ACS, PE, dissection, pneumonia, pneumothorax, cardiomyopathy, pericarditis.    Physical exam is reassuring, patient oxygenating well without increased work of breathing or supplemental O2 requirement.  She has no leg swelling or unilateral leg tenderness though be concerning for DVT.  She does have some pleuritic component of her chest pain and has endorsed some leg swelling in the past.  Though it sounds closer to dependent edema than anything else I believe dimer is appropriate.   Her ACS workup as below is very reassuring.  It is possible that her symptoms are secondary to recent marijuana cessation in the setting of relatively severe anxiety.  She did not appear to to be acutely anxious on my assessment thus do not believe she requires immediate medical therapy for this.  Given her workup is extremely reassuring we will defer further emergent investigation of her symptoms.  She was given a referral for PCP and was discharged in stable condition with close follow-up with PCP.  Interpretations, interventions, and the patient's course of care are documented below.    Clinical Course as of 01/09/24 1335  Fri Jan 09, 2024  1154 Troponin I (High Sensitivity): <2 [RC]  1154 BMP without gross electrolyte abnormalities, renal function appropriate [RC]  1155 Hemoglobin(!): 11.9 Your baseline.  CBC otherwise unremarkable particular no leukocytosis [RC]  1155 DG Chest 2 View No acute cardiopulmonary abnormality [RC]  1155 EKG 12-Lead [RC]  1156 EKG 12-Lead NSR with normal axis intervals.  No STE or depression.  No T WI [RC]  1156 Preg, Serum: NEGATIVE [RC]  1334 D-Dimer, Quant: <0.27 [RC]  1334 Troponin I (High Sensitivity): <2 [RC]    Clinical Course User Index [RC] Sharyne Darina RAMAN, MD      Disposition:  I discussed the plan for discharge with the patient and/or their surrogate at bedside prior to discharge and they were in agreement with the plan and verbalized understanding of the return precautions provided. All questions answered to the best of my ability. Ultimately, the patient was discharged in stable condition with stable vital signs. I am reassured that they are capable of close follow  up and good social support at home.   Clinical Impression:  1. Chest pain on breathing     The plan for this patient was discussed with Dr. Yolande, who voiced agreement and who oversaw evaluation and treatment of this patient.   Clinical Complexity A medically appropriate  history, review of systems, and physical exam was performed.  My independent interpretations of EKG, labs, and radiology are documented in the ED course above.   If decision rules were used in this patient's evaluation, they are listed below.   Click here for ABCD2, HEART and other calculatorsREFRESH Note before signing   Patient's presentation is most consistent with acute presentation with potential threat to life or bodily function.  Medical Decision Making Amount and/or Complexity of Data Reviewed Labs: ordered. Decision-making details documented in ED Course. Radiology: ordered. Decision-making details documented in ED Course. ECG/medicine tests:  Decision-making details documented in ED Course.    HPI/ROS      See MDM section for pertinent HPI and ROS. A complete ROS was performed with pertinent positives/negatives noted above.   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  Constitutional:      General: She is not in acute distress.    Appearance: She is well-developed.  HENT:     Head: Normocephalic and atraumatic.  Eyes:     Conjunctiva/sclera: Conjunctivae normal.  Cardiovascular:     Rate and Rhythm: Normal rate and regular rhythm.     Heart sounds: No murmur heard. Pulmonary:      Effort: Pulmonary effort is normal. No respiratory distress.     Breath sounds: Normal breath sounds.  Abdominal:     Palpations: Abdomen is soft.     Tenderness: There is no abdominal tenderness.  Musculoskeletal:        General: No swelling.     Cervical back: Neck supple.     Right lower leg: No edema.     Left lower leg: No edema.  Skin:    General: Skin is warm and dry.     Capillary Refill: Capillary refill takes less than 2 seconds.  Neurological:     Mental Status: She is alert.  Psychiatric:        Mood and Affect: Mood normal.      Procedures   If procedures were preformed on this patient, they are listed below:  Procedures   @BBSIG @   Please note that this documentation was produced with the assistance of voice-to-text technology and may contain errors.    Sharyne Darina RAMAN, MD 01/09/24 1335    Yolande Lamar BROCKS, MD 01/10/24 1929
